# Patient Record
Sex: Male | Born: 1965 | Race: White | Hispanic: No | Marital: Married | Smoking: Never smoker
Health system: Southern US, Community
[De-identification: ages and names within clinical notes are randomized; demographics above are authoritative.]

## PROBLEM LIST (undated history)

## (undated) DIAGNOSIS — S92909A Unspecified fracture of unspecified foot, initial encounter for closed fracture: Secondary | ICD-10-CM

## (undated) DIAGNOSIS — G43909 Migraine, unspecified, not intractable, without status migrainosus: Secondary | ICD-10-CM

## (undated) DIAGNOSIS — I519 Heart disease, unspecified: Secondary | ICD-10-CM

## (undated) DIAGNOSIS — N201 Calculus of ureter: Secondary | ICD-10-CM

## (undated) DIAGNOSIS — N200 Calculus of kidney: Secondary | ICD-10-CM

## (undated) HISTORY — DX: Unspecified fracture of unspecified foot, initial encounter for closed fracture: S92.909A

## (undated) HISTORY — DX: Heart disease, unspecified: I51.9

## (undated) HISTORY — PX: CARDIOVASCULAR STRESS TEST: SHX262

## (undated) HISTORY — DX: Migraine, unspecified, not intractable, without status migrainosus: G43.909

---

## 2002-08-18 ENCOUNTER — Encounter: Payer: Self-pay | Admitting: Emergency Medicine

## 2002-08-18 ENCOUNTER — Emergency Department (HOSPITAL_COMMUNITY): Admission: EM | Admit: 2002-08-18 | Discharge: 2002-08-18 | Payer: Self-pay | Admitting: Emergency Medicine

## 2009-06-20 DIAGNOSIS — N200 Calculus of kidney: Secondary | ICD-10-CM

## 2009-06-20 HISTORY — DX: Calculus of kidney: N20.0

## 2011-03-14 ENCOUNTER — Encounter (HOSPITAL_COMMUNITY): Payer: Self-pay

## 2011-03-14 ENCOUNTER — Emergency Department (HOSPITAL_COMMUNITY)
Admission: EM | Admit: 2011-03-14 | Discharge: 2011-03-14 | Disposition: A | Discharge status: Home / Self Care | Payer: Self-pay | Attending: Emergency Medicine | Admitting: Emergency Medicine

## 2011-03-14 ENCOUNTER — Emergency Department (HOSPITAL_COMMUNITY): Payer: Self-pay

## 2011-03-14 DIAGNOSIS — N1330 Unspecified hydronephrosis: Secondary | ICD-10-CM | POA: Insufficient documentation

## 2011-03-14 DIAGNOSIS — N200 Calculus of kidney: Secondary | ICD-10-CM | POA: Insufficient documentation

## 2011-03-14 DIAGNOSIS — M545 Low back pain, unspecified: Secondary | ICD-10-CM | POA: Insufficient documentation

## 2011-03-14 DIAGNOSIS — R109 Unspecified abdominal pain: Secondary | ICD-10-CM | POA: Insufficient documentation

## 2011-03-14 DIAGNOSIS — N201 Calculus of ureter: Secondary | ICD-10-CM | POA: Insufficient documentation

## 2011-03-14 DIAGNOSIS — R110 Nausea: Secondary | ICD-10-CM | POA: Insufficient documentation

## 2011-03-14 HISTORY — DX: Calculus of kidney: N20.0

## 2011-03-14 LAB — URINALYSIS, ROUTINE W REFLEX MICROSCOPIC
Bilirubin Urine: NEGATIVE
Ketones, ur: 15 mg/dL — AB
Leukocytes, UA: NEGATIVE
Nitrite: NEGATIVE
Urobilinogen, UA: 0.2 mg/dL (ref 0.0–1.0)
pH: 5.5 (ref 5.0–8.0)

## 2011-03-14 LAB — POCT I-STAT, CHEM 8
BUN: 10 mg/dL (ref 6–23)
Calcium, Ion: 1.12 mmol/L (ref 1.12–1.32)
Glucose, Bld: 113 mg/dL — ABNORMAL HIGH (ref 70–99)
HCT: 42 % (ref 39.0–52.0)
Hemoglobin: 14.3 g/dL (ref 13.0–17.0)
Sodium: 141 mEq/L (ref 135–145)
TCO2: 22 mmol/L (ref 0–100)

## 2011-03-14 LAB — URINE MICROSCOPIC-ADD ON

## 2011-08-05 ENCOUNTER — Encounter (HOSPITAL_COMMUNITY): Payer: Self-pay | Admitting: Pharmacy Technician

## 2011-08-05 ENCOUNTER — Other Ambulatory Visit: Payer: Self-pay | Admitting: Urology

## 2011-08-05 ENCOUNTER — Encounter (HOSPITAL_COMMUNITY): Payer: Self-pay | Admitting: *Deleted

## 2011-08-05 NOTE — Progress Notes (Signed)
Pt reminded to take laxative 08/10/11 pm and no Aspirin or aspirin products 4 days prior to procedure

## 2011-08-09 ENCOUNTER — Encounter (HOSPITAL_COMMUNITY): Payer: Self-pay | Admitting: Pharmacy Technician

## 2011-08-10 MED ORDER — CIPROFLOXACIN HCL 500 MG PO TABS
500.0000 mg | ORAL_TABLET | ORAL | Status: AC
  Administered 2011-08-11: 500 mg via ORAL

## 2011-08-10 NOTE — H&P (Signed)
  Active Problems Problems  1. Ureteral Stone Right 592.1  History of Present Illness  Charles Mccall returns tdoay in f/u for his 7mm right ureteral stone that was initially diagnosed on 03/14/11.  He has remained generally symptom free since September.  He took tamsulosin for a while but is out.   Past Medical History Problems  1. History of  Nephrolithiasis V13.01  Surgical History Problems  1. History of  No Surgical Problems  Current Meds 1. Oxycodone-Acetaminophen 5-325 MG Oral Tablet; TAKE 1 TO 2 TABLETS EVERY 4 TO 6 HOURS  AS NEEDED; Therapy: 11Oct2012 to (Evaluate:14Oct2012); Last Rx:11Oct2012 2. Zofran 4 MG Oral Tablet; Therapy: (Recorded:24Sep2012) to  Allergies Medication  1. Penicillins  Family History Problems  1. Family history of  Family Health Status Number Of Children 2 sons  Social History Problems  1. Caffeine Use 3 a day 2. Marital History - Currently Married 3. Never A Smoker 4. Occupation: Psychologist, occupational  5. History of  Alcohol Use  Review of Systems  Cardiovascular: no chest pain.  Respiratory: no shortness of breath.    Vitals Vital Signs [Data Includes: Last 1 Day]  24Dec2012 10:07AM  Blood Pressure: 127 / 75 Temperature: 97.2 F Heart Rate: 78  Physical Exam Constitutional: Well nourished and well developed . No acute distress.  Pulmonary: No respiratory distress and normal respiratory rhythm and effort.  Cardiovascular: Heart rate and rhythm are normal . No peripheral edema.    Results/Data Urine [Data Includes: Last 1 Day]   24Dec2012  COLOR YELLOW   APPEARANCE CLEAR   SPECIFIC GRAVITY 1.025   pH 5.5   GLUCOSE NEG mg/dL  BILIRUBIN NEG   KETONE NEG mg/dL  BLOOD NEG   PROTEIN NEG mg/dL  UROBILINOGEN 0.2 mg/dL  NITRITE NEG   LEUKOCYTE ESTERASE NEG    The following images/tracing/specimen were independently visualized:  KUB today shows minimal change in the location of the right mid ureteral stone. It may have moved a cm or so.  He has stable right renal stones.    Assessment Assessed  1. Ureteral Stone Right 592.1   He has minimal progression of his right mid ureteral stone, but is not having too much in the way of symptoms.   Plan Health Maintenance (V70.0)  1. UA With REFLEX  Done: 24Dec2012 09:20AM Ureteral Stone (592.1)  2. Follow-up Schedule Surgery Office  Follow-up  Requested for: 24Dec2012   I discussed ESWL and ureteroscopy and have reviewed the risks in detail.   He is most interested in ESWL and the risks noted are bleeding, infection, injury to adjacent organs, failure of fragmentation and need for secondary procedures, thrombotic events and sedation risks.   He is supposed to get insurance coverage at the first of the year and would like to evaluate his coverage situation prior to scheduling.

## 2011-08-10 NOTE — Discharge Instructions (Signed)
Lithotripsy Care After Refer to this sheet for the next few weeks. These discharge instructions provide you with general information on caring for yourself after you leave the hospital. Your caregiver may also give you specific instructions. Your treatment has been planned according to the most current medical practices available, but unavoidable complications sometimes occur. If you have any problems or questions after discharge, please call your caregiver. AFTER THE PROCEDURE   The recovery time will vary with the procedure done.   You will be taken to the recovery area. A nurse will watch and check your progress. Once you are awake, stable, and taking fluids well, you will be allowed to go home as long as there are no problems.   Your urine may have a red tinge for a few days after treatment. Blood loss is usually minimal.   You may have soreness in the back or flank area. This usually goes away after a few days. The procedure can cause blotches or bruises on the back where the pressure wave enters the skin. These marks usually cause only minimal discomfort and should disappear in a short time.   Stone fragments should begin to pass within 24 hours of treatment. However, a delayed passage is not unusual.   You may have pain, discomfort, and feel sick to your stomach (nauseous) when the crushed (pulverized) fragments of stone are passed down the tube from the kidney to the bladder. Stone fragments can pass soon after the procedure and may last for up to 4 to 8 weeks.   A small number of patients may have severe pain when stone fragments are not able to pass, which leads to an obstruction.   If your stone is greater than 1 inch/2.5 centimeters in diameter or if you have multiple stones that have a combined diameter greater than 1 inch/2.5 centimeters, you may require more than 1 treatment.   You must have someone drive you home.  HOME CARE INSTRUCTIONS   Rest at home until you feel your  energy improving.   Only take over-the-counter or prescription medicines for pain, discomfort, or fever as directed by your caregiver. Depending on the type of lithotripsy, you may need to take medicines (antibiotics) that kill germs and anti-inflammatory medicines for a few days.   Drink enough water and fluids to keep your urine clear or pale yellow. This helps "flush" your kidneys. It helps pass any remaining pieces of stone and prevents stones from coming back.   Most people can resume daily activities within 1 or 2 days after standard lithotripsy. It can take longer to recover from laser and percutaneous lithotripsy.   If the stones are in your urinary system, you may be asked to strain your urine at home to look for stones. Any stones that are found can be sent to a medical lab for examination.   Visit your caregiver for a follow-up appointment in a few weeks. Your doctor may remove your stent if you have one. Your caregiver will also check to see whether stone particles still remain.  SEEK MEDICAL CARE IF:   You have an oral temperature above 102 F (38.9 C).   Your pain is not relieved by medicine.   You have a lasting nauseous feeling.   You feel there is too much blood in the urine.   You develop persistent problems with frequent and/or painful urination that does not at least partially improve after 2 days following the procedure.   You have a congested   cough.  °· You feel lightheaded.  °· You develop a rash or any other signs that might suggest an allergic problem.  °· You develop any reaction or side effects to your medicine(s).  °SEEK IMMEDIATE MEDICAL CARE IF:  °· You experience severe back and/or flank pain.  °· You see nothing but blood when you urinate.  °· You cannot pass any urine at all.  °· You have an oral temperature above 102° F (38.9° C), not controlled by medicine.  °· You develop shortness of breath, difficulty breathing, or chest pain.  °· You develop vomiting  that will not stop after 6 to 8 hours.  °· You have a fainting episode.  °MAKE SURE YOU:  °· Understand these instructions.  °· Will watch your condition.  °· Will get help right away if you are not doing well or get worse.  °Document Released: 06/26/2007 Document Revised: 02/02/2011 Document Reviewed: 06/26/2007 °[employer]® Patient Information ©2012 [employer]. °

## 2011-08-11 ENCOUNTER — Ambulatory Visit (HOSPITAL_COMMUNITY): Payer: Self-pay

## 2011-08-11 ENCOUNTER — Encounter (HOSPITAL_COMMUNITY)
Admission: RE | Disposition: A | Discharge status: Home / Self Care | Payer: Self-pay | Source: Ambulatory Visit | Attending: Urology

## 2011-08-11 ENCOUNTER — Ambulatory Visit (HOSPITAL_COMMUNITY)
Admission: RE | Admit: 2011-08-11 | Discharge: 2011-08-11 | Disposition: A | Discharge status: Home / Self Care | Payer: Self-pay | Source: Ambulatory Visit | Attending: Urology | Admitting: Urology

## 2011-08-11 ENCOUNTER — Encounter (HOSPITAL_COMMUNITY): Payer: Self-pay | Admitting: *Deleted

## 2011-08-11 DIAGNOSIS — Z01818 Encounter for other preprocedural examination: Secondary | ICD-10-CM | POA: Insufficient documentation

## 2011-08-11 DIAGNOSIS — Z79899 Other long term (current) drug therapy: Secondary | ICD-10-CM | POA: Insufficient documentation

## 2011-08-11 DIAGNOSIS — N201 Calculus of ureter: Secondary | ICD-10-CM | POA: Diagnosis present

## 2011-08-11 HISTORY — PX: EXTRACORPOREAL SHOCK WAVE LITHOTRIPSY: SHX1557

## 2011-08-11 SURGERY — LITHOTRIPSY, ESWL
Anesthesia: LOCAL | Laterality: Right

## 2011-08-11 MED ORDER — DEXTROSE-NACL 5-0.45 % IV SOLN
INTRAVENOUS | Status: DC
  Administered 2011-08-11: 06:00:00 via INTRAVENOUS

## 2011-08-11 MED ORDER — DIAZEPAM 5 MG PO TABS
10.0000 mg | ORAL_TABLET | ORAL | Status: AC
  Administered 2011-08-11: 10 mg via ORAL

## 2011-08-11 MED ORDER — TAMSULOSIN HCL 0.4 MG PO CAPS: 0.4000 mg | ORAL_CAPSULE | Freq: Every day | ORAL | Status: DC

## 2011-08-11 MED ORDER — DIPHENHYDRAMINE HCL 25 MG PO CAPS
ORAL_CAPSULE | ORAL | Status: AC
  Filled 2011-08-11: qty 1

## 2011-08-11 MED ORDER — DIAZEPAM 5 MG PO TABS
ORAL_TABLET | ORAL | Status: AC
  Filled 2011-08-11: qty 2

## 2011-08-11 MED ORDER — OXYCODONE-ACETAMINOPHEN 5-325 MG PO TABS: 1.0000 | ORAL_TABLET | ORAL | Status: AC | PRN

## 2011-08-11 MED ORDER — CIPROFLOXACIN HCL 500 MG PO TABS
ORAL_TABLET | ORAL | Status: AC
  Filled 2011-08-11: qty 1

## 2011-08-11 MED ORDER — DIPHENHYDRAMINE HCL 25 MG PO CAPS
25.0000 mg | ORAL_CAPSULE | ORAL | Status: AC
  Administered 2011-08-11: 25 mg via ORAL

## 2011-08-11 NOTE — Progress Notes (Signed)
Very sleepy encouraged deep breathing and fluids.

## 2011-08-11 NOTE — Progress Notes (Signed)
Laxative as directed NPO past midnight no aspirin or ibuprofen.

## 2011-08-11 NOTE — Interval H&P Note (Signed)
History and Physical Interval Note:  08/11/2011 7:35 AM  Charles Mccall  has presented today for surgery, with the diagnosis of right ureteral stone   The various methods of treatment have been discussed with the patient and family. After consideration of risks, benefits and other options for treatment, the patient has consented to  Procedure(s) (LRB): EXTRACORPOREAL SHOCK WAVE LITHOTRIPSY (ESWL) (Right) as a surgical intervention .  The patients' history has been reviewed, patient examined, no change in status, stable for surgery.  I have reviewed the patients' chart and labs.  Questions were answered to the patient's satisfaction.     Kellina Dreese J

## 2011-08-11 NOTE — Op Note (Signed)
Please see [facility] op note scanned in the chart.

## 2011-08-12 ENCOUNTER — Encounter (HOSPITAL_COMMUNITY): Payer: Self-pay

## 2011-09-14 ENCOUNTER — Other Ambulatory Visit: Payer: Self-pay | Admitting: Urology

## 2011-09-26 ENCOUNTER — Encounter (HOSPITAL_BASED_OUTPATIENT_CLINIC_OR_DEPARTMENT_OTHER): Payer: Self-pay | Admitting: *Deleted

## 2011-09-26 NOTE — Progress Notes (Signed)
NPO AFTER MN. ARRIVES AT 0815. NEEDS KUB, HG , AND EKG. MAY TAKE RX PAIN MED IF NEEDED W/ SIPS OF WATER.

## 2011-09-28 NOTE — H&P (Signed)
   Charles Mccall had an episode of pain on Sunday and a KUB today shows that his RUVJ stone hasn't moved and an additional 3-10mm stone has fallen into the distal ureter.  He is doing well today.   Past Medical History Problems  1. History of  Nephrolithiasis V13.01  Surgical History Problems  1. History of  Lithotripsy 2. History of  No Surgical Problems  Current Meds 1. Oxycodone-Acetaminophen 5-325 MG Oral Tablet; TAKE 1 TO 2 TABLETS EVERY 4 TO 6 HOURS  AS NEEDED; Therapy: 11Oct2012 to (Evaluate:14Oct2012); Last Rx:11Oct2012 2. Zofran 4 MG Oral Tablet; Therapy: (Recorded:24Sep2012) to  Allergies Medication  1. Penicillins 2. Tamsulosin HCl CAPS  Family History Problems  1. Family history of  Family Health Status Number Of Children 2 sons  Social History Problems  1. Caffeine Use 3 a day 2. Marital History - Currently Married 3. Never A Smoker 4. Occupation: Psychologist, occupational  5. History of  Alcohol Use  Review of Systems  Gastrointestinal: flank pain, but no nausea.    Vitals Vital Signs [Data Includes: Last 1 Day]  27Mar2013 09:31AM  BMI Calculated: 28.7 BSA Calculated: 2.13 Height: 5 ft 11 in Weight: 205 lb  Blood Pressure: 116 / 78 Temperature: 98.3 F Heart Rate: 77  Physical Exam Pulmonary: No respiratory distress and normal respiratory rhythm and effort.  Cardiovascular: Heart rate and rhythm are normal . No peripheral edema.    Results/Data Urine [Data Includes: Last 1 Day]   27Mar2013  COLOR YELLOW   APPEARANCE CLEAR   SPECIFIC GRAVITY 1.025   pH 5.5   GLUCOSE NEG mg/dL  BILIRUBIN NEG   KETONE TRACE mg/dL  BLOOD NEG   PROTEIN TRACE mg/dL  UROBILINOGEN 0.2 mg/dL  NITRITE NEG   LEUKOCYTE ESTERASE NEG    The following images/tracing/specimen were independently visualized:  KUB reviewed and noted above.    Assessment Assessed  1. Ureteral Stone Right 592.1   RUVJ stones with recurrent pain.   Plan Ureteral Stone (592.1)  1. Follow-up  Schedule Surgery Office  Follow-up  Requested for: 27Mar2013   I am going to get him set up for right ureteroscopy.  I have reviewed the risks of bleeding, infection, ureteral injury, need for stent, thrombotic events and anesthetic complications.

## 2011-09-29 ENCOUNTER — Ambulatory Visit (HOSPITAL_COMMUNITY): Payer: Managed Care, Other (non HMO)

## 2011-09-29 ENCOUNTER — Encounter (HOSPITAL_BASED_OUTPATIENT_CLINIC_OR_DEPARTMENT_OTHER): Payer: Self-pay | Admitting: Anesthesiology

## 2011-09-29 ENCOUNTER — Encounter (HOSPITAL_BASED_OUTPATIENT_CLINIC_OR_DEPARTMENT_OTHER): Payer: Self-pay

## 2011-09-29 ENCOUNTER — Ambulatory Visit (HOSPITAL_BASED_OUTPATIENT_CLINIC_OR_DEPARTMENT_OTHER)
Admission: RE | Admit: 2011-09-29 | Discharge: 2011-09-29 | Disposition: A | Discharge status: Home / Self Care | Payer: Managed Care, Other (non HMO) | Source: Ambulatory Visit | Attending: Urology | Admitting: Urology

## 2011-09-29 ENCOUNTER — Encounter (HOSPITAL_BASED_OUTPATIENT_CLINIC_OR_DEPARTMENT_OTHER)
Admission: RE | Disposition: A | Discharge status: Home / Self Care | Payer: Self-pay | Source: Ambulatory Visit | Attending: Urology

## 2011-09-29 ENCOUNTER — Ambulatory Visit (HOSPITAL_BASED_OUTPATIENT_CLINIC_OR_DEPARTMENT_OTHER): Payer: Managed Care, Other (non HMO) | Admitting: Anesthesiology

## 2011-09-29 DIAGNOSIS — N201 Calculus of ureter: Secondary | ICD-10-CM | POA: Insufficient documentation

## 2011-09-29 HISTORY — DX: Calculus of ureter: N20.1

## 2011-09-29 LAB — POCT HEMOGLOBIN-HEMACUE: Hemoglobin: 14.5 g/dL (ref 13.0–17.0)

## 2011-09-29 SURGERY — CYSTOURETEROSCOPY, WITH RETROGRADE PYELOGRAM AND STENT INSERTION
Anesthesia: General | Site: Ureter | Wound class: Clean Contaminated

## 2011-09-29 MED ORDER — ONDANSETRON HCL 4 MG/2ML IJ SOLN: 4.0000 mg | Freq: Four times a day (QID) | INTRAMUSCULAR | Status: DC | PRN

## 2011-09-29 MED ORDER — SODIUM CHLORIDE 0.9 % IJ SOLN: 3.0000 mL | Freq: Two times a day (BID) | INTRAMUSCULAR | Status: DC

## 2011-09-29 MED ORDER — OXYCODONE HCL 5 MG PO TABS: 5.0000 mg | ORAL_TABLET | ORAL | Status: DC | PRN

## 2011-09-29 MED ORDER — BELLADONNA ALKALOIDS-OPIUM 16.2-60 MG RE SUPP
RECTAL | Status: DC | PRN
  Administered 2011-09-29: 1 via RECTAL

## 2011-09-29 MED ORDER — IOHEXOL 350 MG/ML SOLN
INTRAVENOUS | Status: DC | PRN
  Administered 2011-09-29: 10 mL via INTRAVENOUS

## 2011-09-29 MED ORDER — ACETAMINOPHEN 650 MG RE SUPP: 650.0000 mg | RECTAL | Status: DC | PRN

## 2011-09-29 MED ORDER — HYDROCODONE-ACETAMINOPHEN 5-500 MG PO TABS: 1.0000 | ORAL_TABLET | ORAL | Status: DC | PRN

## 2011-09-29 MED ORDER — MIDAZOLAM HCL 5 MG/5ML IJ SOLN
INTRAMUSCULAR | Status: DC | PRN
  Administered 2011-09-29: 2 mg via INTRAVENOUS

## 2011-09-29 MED ORDER — HYOSCYAMINE SULFATE 0.125 MG SL SUBL: 0.1250 mg | SUBLINGUAL_TABLET | SUBLINGUAL | Status: DC | PRN

## 2011-09-29 MED ORDER — LIDOCAINE HCL 2 % EX GEL
CUTANEOUS | Status: DC | PRN
  Administered 2011-09-29: 1

## 2011-09-29 MED ORDER — CIPROFLOXACIN IN D5W 400 MG/200ML IV SOLN
400.0000 mg | INTRAVENOUS | Status: AC
  Administered 2011-09-29: 400 mg via INTRAVENOUS

## 2011-09-29 MED ORDER — HYDROCODONE-ACETAMINOPHEN 5-325 MG PO TABS
1.0000 | ORAL_TABLET | ORAL | Status: AC | PRN
  Administered 2011-09-29: 1 via ORAL

## 2011-09-29 MED ORDER — LACTATED RINGERS IV SOLN
INTRAVENOUS | Status: DC | PRN
  Administered 2011-09-29 (×2): via INTRAVENOUS

## 2011-09-29 MED ORDER — ONDANSETRON HCL 4 MG/2ML IJ SOLN
INTRAMUSCULAR | Status: DC | PRN
  Administered 2011-09-29: 4 mg via INTRAVENOUS

## 2011-09-29 MED ORDER — SODIUM CHLORIDE 0.9 % IJ SOLN: 3.0000 mL | INTRAMUSCULAR | Status: DC | PRN

## 2011-09-29 MED ORDER — DEXAMETHASONE SODIUM PHOSPHATE 4 MG/ML IJ SOLN
INTRAMUSCULAR | Status: DC | PRN
  Administered 2011-09-29: 4 mg via INTRAVENOUS

## 2011-09-29 MED ORDER — PHENAZOPYRIDINE HCL 200 MG PO TABS
200.0000 mg | ORAL_TABLET | Freq: Three times a day (TID) | ORAL | Status: DC
  Administered 2011-09-29: 200 mg via ORAL

## 2011-09-29 MED ORDER — PROPOFOL 10 MG/ML IV EMUL
INTRAVENOUS | Status: DC | PRN
  Administered 2011-09-29: 250 mg via INTRAVENOUS

## 2011-09-29 MED ORDER — ACETAMINOPHEN 325 MG PO TABS: 650.0000 mg | ORAL_TABLET | ORAL | Status: DC | PRN

## 2011-09-29 MED ORDER — FENTANYL CITRATE 0.05 MG/ML IJ SOLN
INTRAMUSCULAR | Status: DC | PRN
  Administered 2011-09-29: 25 ug via INTRAVENOUS
  Administered 2011-09-29: 50 ug via INTRAVENOUS
  Administered 2011-09-29: 100 ug via INTRAVENOUS
  Administered 2011-09-29 (×5): 25 ug via INTRAVENOUS

## 2011-09-29 MED ORDER — LIDOCAINE HCL (CARDIAC) 20 MG/ML IV SOLN
INTRAVENOUS | Status: DC | PRN
  Administered 2011-09-29: 100 mg via INTRAVENOUS

## 2011-09-29 MED ORDER — FENTANYL CITRATE 0.05 MG/ML IJ SOLN: 25.0000 ug | INTRAMUSCULAR | Status: DC | PRN

## 2011-09-29 MED ORDER — PHENAZOPYRIDINE HCL 200 MG PO TABS: 200.0000 mg | ORAL_TABLET | Freq: Three times a day (TID) | ORAL | Status: AC | PRN

## 2011-09-29 MED ORDER — SODIUM CHLORIDE 0.9 % IR SOLN
Status: DC | PRN
  Administered 2011-09-29: 6000 mL

## 2011-09-29 MED ORDER — KETOROLAC TROMETHAMINE 30 MG/ML IJ SOLN
INTRAMUSCULAR | Status: DC | PRN
  Administered 2011-09-29: 30 mg via INTRAVENOUS

## 2011-09-29 MED ORDER — SODIUM CHLORIDE 0.9 % IV SOLN: 250.0000 mL | INTRAVENOUS | Status: DC | PRN

## 2011-09-29 MED ORDER — LACTATED RINGERS IV SOLN
INTRAVENOUS | Status: DC
  Administered 2011-09-29: 09:00:00 via INTRAVENOUS

## 2011-09-29 SURGICAL SUPPLY — 37 items
BAG DRAIN URO-CYSTO SKYTR STRL (DRAIN) ×2 IMPLANT
BAG DRN UROCATH (DRAIN) ×1
BALLN NEPHROSTOMY (BALLOONS) ×2
BALLOON NEPHROSTOMY (BALLOONS) IMPLANT
BASKET LASER NITINOL 1.9FR (BASKET) IMPLANT
BASKET ZERO TIP NITINOL 2.4FR (BASKET) ×1 IMPLANT
BRUSH URET BIOPSY 3F (UROLOGICAL SUPPLIES) IMPLANT
BSKT STON RTRVL 120 1.9FR (BASKET)
BSKT STON RTRVL ZERO TP 2.4FR (BASKET) ×1
CANISTER SUCT LVC 12 LTR MEDI- (MISCELLANEOUS) ×1 IMPLANT
CATH URET 5FR 28IN CONE TIP (BALLOONS)
CATH URET 5FR 28IN OPEN ENDED (CATHETERS) IMPLANT
CATH URET 5FR 70CM CONE TIP (BALLOONS) IMPLANT
CLOTH BEACON ORANGE TIMEOUT ST (SAFETY) ×2 IMPLANT
DRAPE CAMERA CLOSED 9X96 (DRAPES) ×2 IMPLANT
ELECT REM PT RETURN 9FT ADLT (ELECTROSURGICAL)
ELECTRODE REM PT RTRN 9FT ADLT (ELECTROSURGICAL) IMPLANT
GLOVE ECLIPSE 6.5 STRL STRAW (GLOVE) ×1 IMPLANT
GLOVE INDICATOR 7.0 STRL GRN (GLOVE) ×1 IMPLANT
GLOVE SURG SS PI 8.0 STRL IVOR (GLOVE) ×2 IMPLANT
GOWN PREVENTION PLUS LG XLONG (DISPOSABLE) IMPLANT
GOWN STRL REIN XL XLG (GOWN DISPOSABLE) ×2 IMPLANT
GUIDEWIRE 0.038 PTFE COATED (WIRE) IMPLANT
GUIDEWIRE ANG ZIPWIRE 038X150 (WIRE) IMPLANT
GUIDEWIRE STR DUAL SENSOR (WIRE) ×2 IMPLANT
IV NS IRRIG 3000ML ARTHROMATIC (IV SOLUTION) ×3 IMPLANT
KIT BALLIN UROMAX 15FX10 (LABEL) IMPLANT
KIT BALLN UROMAX 15FX4 (MISCELLANEOUS) IMPLANT
KIT BALLN UROMAX 26 75X4 (MISCELLANEOUS) ×1
LASER FIBER DISP (UROLOGICAL SUPPLIES) ×1 IMPLANT
LASER FIBER DISP 1000U (UROLOGICAL SUPPLIES) IMPLANT
PACK CYSTOSCOPY (CUSTOM PROCEDURE TRAY) ×2 IMPLANT
SET HIGH PRES BAL DIL (LABEL)
SHEATH URET ACCESS 12FR/35CM (UROLOGICAL SUPPLIES) ×1 IMPLANT
SHEATH URET ACCESS 12FR/55CM (UROLOGICAL SUPPLIES) IMPLANT
STENT CONTOUR 6FRX26X.038 (STENTS) IMPLANT
STENT URET 6FRX26 CONTOUR (STENTS) ×1 IMPLANT

## 2011-09-29 NOTE — Anesthesia Postprocedure Evaluation (Signed)
  Anesthesia Post-op Note  Patient: Charles Mccall  Procedure(s) Performed: Procedure(s) (LRB): CYSTOSCOPY WITH RETROGRADE PYELOGRAM, URETEROSCOPY AND STENT PLACEMENT (N/A)  Patient Location: PACU  Anesthesia Type: General  Level of Consciousness: awake and alert   Airway and Oxygen Therapy: Patient Spontanous Breathing  Post-op Pain: mild  Post-op Assessment: Post-op Vital signs reviewed, Patient's Cardiovascular Status Stable, Respiratory Function Stable, Patent Airway and No signs of Nausea or vomiting  Post-op Vital Signs: stable  Complications: No apparent anesthesia complications

## 2011-09-29 NOTE — Discharge Instructions (Addendum)
Cystoscopy (Bladder Exam) A cystoscopy is an examination of your urinary bladder with a cystoscope. A cystoscope is an instrument like a small telescope with strong lights and lenses. It is inserted into the bladder through the urethra (the opening into the bladder) and allows your caregiver to examine the inside of your bladder. The procedure causes little discomfort and can be done in a hospital or office. It is a diagnostic procedure to evaluate the inside of your bladder. It may involve x-rays to further evaluate the ureters or internal aspects of the kidneys. It may aid in the removal of urinary stones  or in taking tissue samples (biopsies) if necessary. The procedure is easier in females because of a shorter urethra. In a male, the procedure must be done through the penis. This often requires more sedation and more time to do the procedure. The procedure usually takes twenty minutes to one half hour for a male and approximately an hour for a male. LET YOUR CAREGIVERS KNOW ABOUT:  Allergies.   Medications taken including herbs, eye drops, over the counter medications, and creams.   Use of steroids (by mouth or creams).   Previous problems with anesthetics or novocaine.   Possibility of pregnancy, if this applies.   History of blood clots (thrombophlebitis).   History of bleeding or blood problems.   Previous surgery, especially where prosthetics have been used like hip or knee replacements, and heart valve replacements.   Other health problems.  BEFORE THE PROCEDURE  You should be present 60 minutes prior to your procedure or as directed.  PROCEDURE During the procedure, you will:  Be assisted by your urologist and a nurse.   Lie on a cystoscopy table with your knees elevated and legs apart and covered with a drape. For women this is the same position as when a pap smear is taken.   Have the urethral area or penis washed and covered with sterile towels.   Have an anesthetic  (numbing) jelly applied to the urethra. This is usually all that is required for females but males may also require sedation.   Have the cystoscope inserted through the urethra and into the bladder. Sterile fluid will flow through the cystoscope and into the bladder. This will expand the bladder and provide clear fluid for the urologist to look through and examine the interior of the bladder.   Be allowed to go home once you are doing well, are stable, and awake if you were given a sedative. If given a sedative, have someone give you a ride home.  AFTER THE PROCEDURE  You may have temporary bleeding and burning on urination.   Drink lots of fluids.  SEEK IMMEDIATE MEDICAL CARE IF:  There is an increase in blood in the urine or if you are passing clots.   You have difficulty in passing your urine   You develop chills and/or an unexplained oral temperature above 102 F (38.9 C).  Your caregiver will discuss your results with you following the procedure. This may be at a later time if you have been sedated. If other testing or biopsies were taken, ask your caregiver how you are to obtain the results. Remember it is your responsibility to get your results. Do not assume everything is normal if you do not hear from your caregiver. Document Released: 06/03/2000 Document Revised: 05/26/2011 Document Reviewed: 03/27/2008 [facility] Patient Information 2012 [place], [place].  You can remove the stent by pulling the attached string on Sunday morning.  It is not unusual to have some pain after the stent comes out   Post Anesthesia Home Care Instructions  Activity: Get plenty of rest for the remainder of the day. A responsible adult should stay with you for 24 hours following the procedure.  For the next 24 hours, DO NOT: -Drive a car -Advertising copywriter -Drink alcoholic beverages -Take any medication unless instructed by your physician -Make any legal decisions or sign important  papers.  Meals: Start with liquid foods such as gelatin or soup. Progress to regular foods as tolerated. Avoid greasy, spicy, heavy foods. If nausea and/or vomiting occur, drink only clear liquids until the nausea and/or vomiting subsides. Call your physician if vomiting continues.  Special Instructions/Symptoms: Your throat may feel dry or sore from the anesthesia or the breathing tube placed in your throat during surgery. If this causes discomfort, gargle with warm salt water. The discomfort should disappear within 24 hours.  Ureteral Stent A ureteral stent is a soft plastic tube with multiple holes. The stent is inserted into a ureter to help drain urine from the kidney into the bladder. The tube has a coil on each end to keep it from falling out. One end stays in the kidney. The other end stays in the bladder. A stent cannot be seen from the outside. Usually it does not keep you from going about normal routines. A ureteral stent is used to bypass a blockage in your kidney or ureter. This blockage can be caused by kidney stones, scar tissue, pregnancy, or other causes. It can also be used during treatment to remove a kidney stone or to let a ureter heal after surgery. The stent allows urine to drain from the kidney into the bladder. It is most often taken out after the blockage has been removed or the ureter has healed. If a stent is needed for a long time, it will be changed every few months. INSERTING THE STENT Your stent is put in by a urologist. This is a medical doctor trained for treating genitourinary (kidney, ureter and bladder) problems. Before your stent is put in, your caregiver may order x-rays or other imaging tests of your kidneys and ureters. The stent is inserted in a hospital or same day surgical center. You can anticipate going home the same day. PROCEDURE  A special x-ray machine called a fluoroscope is used to guide the insertion of your stent. This allows your doctor to make  sure the stent is in the correct place.   First you are given anesthesia to keep you comfortable.   Then your doctor inserts a special lighted instrument called a cystoscope into your bladder. This allows your doctor to see the opening to the ureter.   A thin wire is carefully threaded into the bladder and up the ureter. The stent is inserted over the wire and the wire is then removed.  HOME CARE INSTRUCTIONS   While the stent is in place, you may feel some discomfort. Certain movements may trigger pain or a feeling that you need to urinate. Your caregiver may give you pain medication. Only take over-the-counter or prescription medicines for pain, discomfort, or fever as directed by your caregiver. Do not take aspirin as this can make bleeding worse.   You may be given medications to prevent infection or bladder spasms. Be sure to take all medications as directed.   Drink plenty of fluids.   You may have small amounts of bleeding causing your urine to be slightly red.  This is nothing to be concerned about.  REMOVAL OF THE STENT Your stent is left in until the blockage is resolved. This may take two weeks or longer. Before the stent is removed, you may have an x-ray make sure the ureter is open. The stent can be removed by your caregiver in the office. Medications may be given for comfort. Be sure to keep all follow-up appointments so your caregiver can check that you are healing properly. SEEK IMMEDIATE MEDICAL CARE IF:   Your urine is dark red or has blood clots.   You are incontinent (leaking urine).   You have an oral temperature above 102 F (38.9 C), chills, nausea (feeling sick to your stomach), or vomiting.   Your pain is not relieved by pain medication. Do not take aspirin as this can make bleeding worse.   The end of the stent comes out of the urethra.  Document Released: 06/03/2000 Document Revised: 05/26/2011 Document Reviewed: 06/02/2008 [facility] Patient Information 2012  [place], [place].Marland Kitchen

## 2011-09-29 NOTE — Brief Op Note (Signed)
09/29/2011  10:41 AM  PATIENT:  Charles Mccall  46 y.o. male  PRE-OPERATIVE DIAGNOSIS:  right distal ureteral stone  POST-OPERATIVE DIAGNOSIS:  right distal ureteral stone  PROCEDURE:  Procedure(s) (LRB): CYSTOSCOPY WITH  URETEROSCOPY WITH LASER  AND STENT PLACEMENT (N/A)  SURGEON:  Surgeon(s) and Role:    * Anner Crete, MD - Primary  PHYSICIAN ASSISTANT:   ASSISTANTS: none   ANESTHESIA:   general  EBL:     BLOOD ADMINISTERED:none  DRAINS: 6 x 26cm JJ stent.   LOCAL MEDICATIONS USED:  LIDOCAINE  and Amount: Jelly 2% 10 ml  SPECIMEN:  Source of Specimen:  stone fragments  DISPOSITION OF SPECIMEN:  To family  COUNTS:  YES  TOURNIQUET:  * No tourniquets in log *  DICTATION: .Other Dictation: Dictation Number [id number]  PLAN OF CARE: Discharge to home after PACU  PATIENT DISPOSITION:  PACU - hemodynamically stable.   Delay start of Pharmacological VTE agent (>24hrs) due to surgical blood loss or risk of bleeding: no

## 2011-09-29 NOTE — Anesthesia Preprocedure Evaluation (Addendum)
Anesthesia Evaluation  Patient identified by MRN, date of birth, ID band Patient awake    Reviewed: Allergy & Precautions, H&P , NPO status , Patient's Chart, lab work & pertinent test results  Airway Mallampati: II TM Distance: <3 FB Neck ROM: Full    Dental No notable dental hx.    Pulmonary neg pulmonary ROS,  breath sounds clear to auscultation  Pulmonary exam normal       Cardiovascular negative cardio ROS  Rhythm:Regular Rate:Normal     Neuro/Psych negative neurological ROS  negative psych ROS   GI/Hepatic negative GI ROS, Neg liver ROS,   Endo/Other  negative endocrine ROS  Renal/GU negative Renal ROS  negative genitourinary   Musculoskeletal negative musculoskeletal ROS (+)   Abdominal   Peds negative pediatric ROS (+)  Hematology negative hematology ROS (+)   Anesthesia Other Findings   Reproductive/Obstetrics negative OB ROS                           Anesthesia Physical Anesthesia Plan  ASA: I  Anesthesia Plan: General   Post-op Pain Management:    Induction: Intravenous  Airway Management Planned: LMA  Additional Equipment:   Intra-op Plan:   Post-operative Plan:   Informed Consent: I have reviewed the patients History and Physical, chart, labs and discussed the procedure including the risks, benefits and alternatives for the proposed anesthesia with the patient or authorized representative who has indicated his/her understanding and acceptance.   Dental advisory given  Plan Discussed with: CRNA  Anesthesia Plan Comments:         Anesthesia Quick Evaluation  

## 2011-09-29 NOTE — Transfer of Care (Signed)
Immediate Anesthesia Transfer of Care Note  Patient: Charles Mccall  Procedure(s) Performed: Procedure(s) (LRB): CYSTOSCOPY WITH RETROGRADE PYELOGRAM, URETEROSCOPY AND STENT PLACEMENT (N/A)  Patient Location: PACU  Anesthesia Type: General  Level of Consciousness: awake, sedated, patient cooperative and responds to stimulation  Airway & Oxygen Therapy: Patient Spontanous Breathing and Patient connected to face mask oxygen  Post-op Assessment: Report given to PACU RN, Post -op Vital signs reviewed and stable and Patient moving all extremities  Post vital signs: Reviewed and stable  Complications: No apparent anesthesia complications

## 2011-09-29 NOTE — Anesthesia Procedure Notes (Signed)
Procedure Name: LMA Insertion Date/Time: 09/29/2011 9:56 AM Performed by: Jessica Priest Pre-anesthesia Checklist: Patient identified, Emergency Drugs available, Suction available and Patient being monitored Patient Re-evaluated:Patient Re-evaluated prior to inductionOxygen Delivery Method: Circle System Utilized Preoxygenation: Pre-oxygenation with 100% oxygen Intubation Type: IV induction Ventilation: Mask ventilation without difficulty LMA: LMA with gastric port inserted LMA Size: 4.0 Number of attempts: 1 Placement Confirmation: positive ETCO2 Tube secured with: Tape Dental Injury: Teeth and Oropharynx as per pre-operative assessment

## 2011-09-29 NOTE — Interval H&P Note (Signed)
History and Physical Interval Note:  09/29/2011 9:48 AM  Charles Mccall  has presented today for surgery, with the diagnosis of right distal ureteral stone  The various methods of treatment have been discussed with the patient and family. After consideration of risks, benefits and other options for treatment, the patient has consented to  Procedure(s) (LRB): CYSTOSCOPY WITH RETROGRADE PYELOGRAM, URETEROSCOPY AND STENT PLACEMENT (N/A) as a surgical intervention .  The patients' history has been reviewed, patient examined, no change in status, stable for surgery.  I have reviewed the patients' chart and labs.  Questions were answered to the patient's satisfaction.     Deniese Oberry J

## 2011-09-30 NOTE — Op Note (Signed)
NAMEFERNAND, SORBELLO NO.:  [id number]  MEDICAL RECORD NO.:  [id number]  LOCATION:                                 FACILITY:  PHYSICIAN:  Excell Seltzer. Annabell Howells, M.D.    DATE OF BIRTH:  Dec 26, 1965  DATE OF PROCEDURE:  09/29/2011 DATE OF DISCHARGE:                              OPERATIVE REPORT   PROCEDURES:  Cystoscopy, right ureteroscopy with laser lithotripsy, and stone manipulation and insertion of right double-J stent.  PREOPERATIVE DIAGNOSIS:  Right distal ureteral stones.  POSTOPERATIVE DIAGNOSIS:  Right distal ureteral stones.  SURGEON:  Excell Seltzer. Annabell Howells, M.D.  ANESTHESIA:  General.  SPECIMEN:  Stone fragments.  DRAINS:  #6 French 26 cm double-J stent.  COMPLICATIONS:  None.  INDICATIONS:  Travus is a 46 year old white male with two  right distal ureteral stones that failed to progress.  He is to undergo ureteroscopy.  FINDINGS OF PROCEDURE:  He was taken to the operating room where general anesthetic was induced.  He was given Cipro.  PAS hoses were placed.  He was placed in lithotomy position.  His perineum and genitalia were prepped with Betadine solution and he was draped in the usual sterile fashion.  Cystoscopy was performed using a 22-French scope and 12 and 70 degree lenses.  Examination revealed a normal urethra.  The external sphincter was intact.  The prostatic urethra was short without obstruction.  Examination of bladder revealed a smooth wall with no tumor stones or inflammation.  Ureteral orifices were unremarkable.  The right ureteral orifice was cannulated with a guidewire, which was passed to the kidney without difficulty.  The cystoscope was removed.  An initial attempt to pass a 12-French introducer sheath and a core dilator was unsuccessful due to a narrowing just proximal to the meatus.  I then used a 4 cm 15-French balloon, which was advanced to just at the level of the stones and inflated with disappearance of the waist  just below the stone.  At this point, the 6-French short ureteroscope was passed alongside the wire.  The stones were visualized, they were engaged with a 375 micron laser fiber at 0.5 watts and 20 Hz and broken into manageable fragments.  The fragments were then removed with a Nitinol basket.  Once all fragments were removed, the ureter was inspected.  There was some mucosal splitting, an area of narrowing and it was felt a stent was indicated.  The cystoscope was reinserted alongside the wire and the stone fragments were evacuated.  I then inserted the cystoscope over the wire and a 6-French 26 cm double- J stent with string was passed without difficulty to the kidney under fluoroscopic guidance.  The wire was removed leaving a good coil in the kidney, good coil in the bladder.  At this point, the bladder was drained.  The cystoscope was removed leaving the stent string exiting the urethra.  The urethra was instilled with 10 mL of 2% lidocaine jelly.  B and O suppository had been placed at the beginning of the procedure.  The stent was secured to the patient's penis with pink tape.  The dressings were removed.  He was taken down from lithotomy  position.  His anesthetic was reversed.  He was moved to the recovery room in stable condition, there were no complications.     Excell Seltzer. Annabell Howells, M.D.     JJW/MEDQ  D:  09/29/2011  T:  09/30/2011  Job:  [id number]

## 2015-06-16 ENCOUNTER — Emergency Department (HOSPITAL_COMMUNITY)
Admission: EM | Admit: 2015-06-16 | Discharge: 2015-06-16 | Disposition: A | Discharge status: Home / Self Care | Payer: Self-pay | Attending: Emergency Medicine | Admitting: Emergency Medicine

## 2015-06-16 ENCOUNTER — Encounter (HOSPITAL_COMMUNITY): Payer: Self-pay | Admitting: Emergency Medicine

## 2015-06-16 ENCOUNTER — Emergency Department (HOSPITAL_COMMUNITY): Payer: Managed Care, Other (non HMO)

## 2015-06-16 DIAGNOSIS — Z880 Allergy status to penicillin: Secondary | ICD-10-CM | POA: Insufficient documentation

## 2015-06-16 DIAGNOSIS — N200 Calculus of kidney: Secondary | ICD-10-CM

## 2015-06-16 DIAGNOSIS — N201 Calculus of ureter: Secondary | ICD-10-CM

## 2015-06-16 DIAGNOSIS — Z79899 Other long term (current) drug therapy: Secondary | ICD-10-CM | POA: Insufficient documentation

## 2015-06-16 DIAGNOSIS — N202 Calculus of kidney with calculus of ureter: Secondary | ICD-10-CM | POA: Insufficient documentation

## 2015-06-16 DIAGNOSIS — R1031 Right lower quadrant pain: Secondary | ICD-10-CM

## 2015-06-16 LAB — URINALYSIS, ROUTINE W REFLEX MICROSCOPIC
BILIRUBIN URINE: NEGATIVE
GLUCOSE, UA: NEGATIVE mg/dL
Ketones, ur: NEGATIVE mg/dL
Nitrite: NEGATIVE
Protein, ur: 30 mg/dL — AB
SPECIFIC GRAVITY, URINE: 1.021 (ref 1.005–1.030)
pH: 7.5 (ref 5.0–8.0)

## 2015-06-16 LAB — LIPASE, BLOOD: Lipase: 23 U/L (ref 11–51)

## 2015-06-16 LAB — CBC
HCT: 43.2 % (ref 39.0–52.0)
Hemoglobin: 14.8 g/dL (ref 13.0–17.0)
MCH: 29.5 pg (ref 26.0–34.0)
MCHC: 34.3 g/dL (ref 30.0–36.0)
MCV: 86.1 fL (ref 78.0–100.0)
Platelets: 168 10*3/uL (ref 150–400)
RBC: 5.02 MIL/uL (ref 4.22–5.81)
RDW: 12.2 % (ref 11.5–15.5)
WBC: 9.8 10*3/uL (ref 4.0–10.5)

## 2015-06-16 LAB — COMPREHENSIVE METABOLIC PANEL
ALBUMIN: 4 g/dL (ref 3.5–5.0)
ALK PHOS: 70 U/L (ref 38–126)
ALT: 26 U/L (ref 17–63)
ANION GAP: 7 (ref 5–15)
AST: 22 U/L (ref 15–41)
BUN: 9 mg/dL (ref 6–20)
CALCIUM: 9.1 mg/dL (ref 8.9–10.3)
CHLORIDE: 108 mmol/L (ref 101–111)
CO2: 26 mmol/L (ref 22–32)
CREATININE: 0.76 mg/dL (ref 0.61–1.24)
GFR calc Af Amer: >60 mL/min (ref >60)
GFR calc non Af Amer: >60 mL/min (ref >60)
GLUCOSE: 122 mg/dL — AB (ref 65–99)
Potassium: 4.3 mmol/L (ref 3.5–5.1)
SODIUM: 141 mmol/L (ref 135–145)
Total Bilirubin: 0.8 mg/dL (ref 0.3–1.2)
Total Protein: 7 g/dL (ref 6.5–8.1)

## 2015-06-16 LAB — URINE MICROSCOPIC-ADD ON

## 2015-06-16 MED ORDER — KETOROLAC TROMETHAMINE 30 MG/ML IJ SOLN: 30.0000 mg | Freq: Once | INTRAMUSCULAR | Status: DC

## 2015-06-16 MED ORDER — SODIUM CHLORIDE 0.9 % IV BOLUS (SEPSIS)
1000.0000 mL | Freq: Once | INTRAVENOUS | Status: AC
  Administered 2015-06-16: 1000 mL via INTRAVENOUS

## 2015-06-16 MED ORDER — OXYCODONE-ACETAMINOPHEN 5-325 MG PO TABS: 2.0000 | ORAL_TABLET | ORAL | Status: DC | PRN

## 2015-06-16 MED ORDER — ONDANSETRON HCL 4 MG/2ML IJ SOLN
4.0000 mg | Freq: Once | INTRAMUSCULAR | Status: AC
  Administered 2015-06-16: 4 mg via INTRAVENOUS
  Filled 2015-06-16: qty 2

## 2015-06-16 MED ORDER — FENTANYL CITRATE (PF) 100 MCG/2ML IJ SOLN
50.0000 ug | Freq: Once | INTRAMUSCULAR | Status: AC
  Administered 2015-06-16: 50 ug via INTRAVENOUS
  Filled 2015-06-16: qty 2

## 2015-06-16 MED ORDER — TAMSULOSIN HCL 0.4 MG PO CAPS: 0.4000 mg | ORAL_CAPSULE | Freq: Every day | ORAL | Status: DC

## 2015-06-16 NOTE — ED Notes (Signed)
Pt reports RLQ abd pain since yesterday. Hx of kidney stones. No blood in urine or v/d.

## 2015-06-16 NOTE — Progress Notes (Signed)
Patient reports [facility] [place].  System updated.

## 2015-06-16 NOTE — ED Provider Notes (Signed)
CSN: [id number]     Arrival date & time 06/16/15  1129 History   None    Chief Complaint  Patient presents with  . Abdominal Pain    HPI   Charles Mccall is an 49 y.o. male with history of kidney stones who presents to the ED for evaluation of RLQ abdominal pain. He states his pain started last night at rest. He states that the pain would come and go and was relieved with OTC pain meds. However, he states he woke up this AM and the pain has gradually become worse and worse. He states that the pain was a "15 out of 10" on the car ride to the ED. He states the pain is in his RLQ and does not radiate. Pt states he has a history of multiple kidney stones and they have always presented similarly. He states that he has never gotten the typical flank pain/back pain with his prior kidney stones. He has not followed with urology in several years. He states he has his appendix. Denies dysuria, urinary frequency/urgency, or gross hematuria. Denies fever, chills. Pt does endorse some nausea that started when he arrived in the ED. Denies emesis. Denies diarrhea.   Past Medical History  Diagnosis Date  . Kidney stones 2011  . Right ureteral stone    Past Surgical History  Procedure Laterality Date  . Extracorporeal shock wave lithotripsy  08-11-2011    RIGHT   History reviewed. No pertinent family history. Social History  Substance Use Topics  . Smoking status: Never Smoker   . Smokeless tobacco: Never Used  . Alcohol Use: No    Review of Systems  All other systems reviewed and are negative.     Allergies  Penicillins  Home Medications   Prior to Admission medications   Medication Sig Start Date End Date Taking? Authorizing Provider  acetaminophen (TYLENOL) 500 MG tablet Take 500 mg by mouth every 6 (six) hours as needed. pain   Yes Historical Provider, MD  HYDROmorphone (DILAUDID) 2 MG tablet Take 2 mg by mouth every 4 (four) hours as needed for moderate pain or severe pain.    Yes  Historical Provider, MD  ibuprofen (ADVIL,MOTRIN) 200 MG tablet Take 400 mg by mouth every 6 (six) hours as needed. pain   Yes Historical Provider, MD  Multiple Vitamin (MULTIVITAMIN) tablet Take 1 tablet by mouth daily.   Yes Historical Provider, MD  Pseudoephedrine-Naproxen Na (SINUS & COLD-D PO) Take 1 tablet by mouth daily as needed (sinus pressure).   Yes Historical Provider, MD   BP 128/89 mmHg  Pulse 86  Temp(Src) 97.6 F (36.4 C) (Oral)  Resp 18  SpO2 99% Physical Exam  Constitutional: He is oriented to person, place, and time.  Appears uncomfortable but NAD  HENT:  Right Ear: External ear normal.  Left Ear: External ear normal.  Nose: Nose normal.  Mouth/Throat: Oropharynx is clear and moist. No oropharyngeal exudate.  Eyes: Conjunctivae and EOM are normal. Pupils are equal, round, and reactive to light.  Neck: Normal range of motion. Neck supple.  Cardiovascular: Normal rate, regular rhythm, normal heart sounds and intact distal pulses.   Pulmonary/Chest: Effort normal and breath sounds normal. No respiratory distress. He has no wheezes. He exhibits no tenderness.  Abdominal: Soft. Bowel sounds are normal. There is tenderness. There is no rigidity, no rebound and no guarding.    Diffuse tenderness along right side of abdomen. Negative Rovsing's, McBurney, rigidity, guarding, or rebound. Negative CVA tenderness.  Musculoskeletal: He exhibits no edema.  Neurological: He is alert and oriented to person, place, and time. No cranial nerve deficit.  Skin: Skin is warm and dry.  Psychiatric: He has a normal mood and affect.  Nursing note and vitals reviewed.   ED Course  Procedures (including critical care time) Labs Review Labs Reviewed  COMPREHENSIVE METABOLIC PANEL - Abnormal; Notable for the following:    Glucose, Bld 122 (*)    All other components within normal limits  URINALYSIS, ROUTINE W REFLEX MICROSCOPIC (NOT AT [facility]) - Abnormal; Notable for the following:     Color, Urine RED (*)    APPearance CLOUDY (*)    Hgb urine dipstick LARGE (*)    Protein, ur 30 (*)    Leukocytes, UA SMALL (*)    All other components within normal limits  URINE MICROSCOPIC-ADD ON - Abnormal; Notable for the following:    Squamous Epithelial / LPF 0-5 (*)    Bacteria, UA RARE (*)    Casts HYALINE CASTS (*)    All other components within normal limits  LIPASE, BLOOD  CBC    Imaging Review Ct Renal Stone Study  06/16/2015  CLINICAL DATA:  Right lower quadrant abdominal pain, history of kidney stones EXAM: CT ABDOMEN AND PELVIS WITHOUT CONTRAST TECHNIQUE: Multidetector CT imaging of the abdomen and pelvis was performed following the standard protocol without IV contrast. COMPARISON:  03/14/2011 FINDINGS: Lower chest: Mild dependent atelectasis in the bilateral lower lobes. Hepatobiliary: Unenhanced liver is unremarkable. Gallbladder is unremarkable. No intrahepatic or extrahepatic ductal dilatation. Pancreas: Within normal limits. Spleen: Within normal limits. Adrenals/Urinary Tract: Adrenal glands are within normal limits. 2 mm nonobstructing interpolar left renal calculus (series 2/ image 33). 3 mm nonobstructing right upper pole renal calculus (series 2/ image 27). 8 mm nonobstructing interpolar right renal calculus (series 2/ image 30). Mild right hydroureteronephrosis. 7 mm distal right ureteral calculus (coronal image 49). Bladder is within normal limits. Stomach/Bowel: Stomach is within normal limits. No evidence of bowel obstruction. Normal appendix (series 2/ image 49). Vascular/Lymphatic: No evidence of abdominal aortic aneurysm. Mild atherosclerotic calcifications of the abdominal aorta and branch vessels. No suspicious abdominopelvic lymphadenopathy. Reproductive: Prostate is unremarkable. Other: No abdominopelvic ascites. Musculoskeletal: Mild degenerative changes at L5-S1. IMPRESSION: 7 mm distal right ureteral calculus. Mild right hydroureteronephrosis. Bilateral  nonobstructing renal calculi, measuring up to 8 mm on the right. Electronically Signed   By: Charline BillsSriyesh  Krishnan M.D.   On: 06/16/2015 16:31   I have personally reviewed and evaluated these images and lab results as part of my medical decision-making.   EKG Interpretation None      MDM   Final diagnoses:  RLQ abdominal pain  Nephrolithiasis  Right ureteral stone    Ddx includes nephrolithiasis, appendicitis, cholecystitis. Given history of multiple kidney stones with exact same presentation, large hemoglobin in urine, no white count, no fever, diffuse right sided abdominal tenderness, I will get CT renal study. I have lower suspicion for other acute intra-abdominal etiology. Will give fentanyl, zofran, and 1L NS bolus.   CT shows y297mm distal right ureteral stone with bilateral nonobstructing renal stones. Pt is able to void comfortably. No evidence of infection. Pt's pain is well-controlled. He is not actively nauseated or vomiting. Will give rx for pain meds and flomax with outpatient urology f/u. ER return precautions given.   Carlene CoriaSerena Y Michiah Masse, PA-C 06/17/15 [id number]  Tilden FossaElizabeth Rees, MD 06/18/15 [id number]

## 2015-06-16 NOTE — Discharge Instructions (Signed)
Your CT scan showed evidence of multiple bilateral kidney stones with a right sided stone in your ureter that is likely causing your symptoms. There is no sign of infection or obstruction at this time. I will give you prescriptions for flomax and Percocet (pain medicine). Drink plenty of water. Please call Dr. Belva Crome office tomorrow morning to schedule outpatient follow-up appointment for further evaluation and management. Return to the ER for new or worsening symptoms.   Take medications as prescribed. Return to the emergency room for worsening condition or new concerning symptoms. Follow up with your regular doctor. If you don't have a regular doctor use one of the numbers below to establish a primary care doctor.   Emergency Department Resource Guide 1) Find a Doctor and Pay Out of Pocket Although you won't have to find out who is covered by your insurance plan, it is a good idea to ask around and get recommendations. You will then need to call the office and see if the doctor you have chosen will accept you as a new patient and what types of options they offer for patients who are self-pay. Some doctors offer discounts or will set up payment plans for their patients who do not have insurance, but you will need to ask so you aren't surprised when you get to your appointment.  2) Contact Your Local Health Department Not all health departments have doctors that can see patients for sick visits, but many do, so it is worth a call to see if yours does. If you don't know where your local health department is, you can check in your phone book. The CDC also has a tool to help you locate your state's health department, and many state websites also have listings of all of their local health departments.  3) Find a Walk-in Clinic If your illness is not likely to be very severe or complicated, you may want to try a walk in clinic. These are popping up all over the country in pharmacies, drugstores, and  shopping centers. They're usually staffed by nurse practitioners or physician assistants that have been trained to treat common illnesses and complaints. They're usually fairly quick and inexpensive. However, if you have serious medical issues or chronic medical problems, these are probably not your best option.  No Primary Care Doctor: - Call Health Connect at  [phone] - they can help you locate a primary care doctor that  accepts your insurance, provides certain services, etc. - Physician Referral [phone]  Emergency Department Resource Guide 1) Find a Doctor and Pay Out of Pocket Although you won't have to find out who is covered by your insurance plan, it is a good idea to ask around and get recommendations. You will then need to call the office and see if the doctor you have chosen will accept you as a new patient and what types of options they offer for patients who are self-pay. Some doctors offer discounts or will set up payment plans for their patients who do not have insurance, but you will need to ask so you aren't surprised when you get to your appointment.  2) Contact Your Local Health Department Not all health departments have doctors that can see patients for sick visits, but many do, so it is worth a call to see if yours does. If you don't know where your local health department is, you can check in your phone book. The CDC also has a tool to help you locate your state's health  department, and many state websites also have listings of all of their local health departments.  3) Find a Walk-in Clinic If your illness is not likely to be very severe or complicated, you may want to try a walk in clinic. These are popping up all over the country in pharmacies, drugstores, and shopping centers. They're usually staffed by nurse practitioners or physician assistants that have been trained to treat common illnesses and complaints. They're usually fairly quick and inexpensive.  However, if you have serious medical issues or chronic medical problems, these are probably not your best option.  No Primary Care Doctor: - Call Health Connect at  [phone] - they can help you locate a primary care doctor that  accepts your insurance, provides certain services, etc. - Physician Referral Service- [phone]  Chronic Pain Problems: Organization         Address  Phone   Notes  [facility]  [phone] Patients need to be referred by their primary care doctor.   Medication Assistance: Organization         Address  Phone   Notes  [facility] [address] [place], [place] [address] [phone] --Must be a resident of [facility] -- Must have NO insurance coverage whatsoever (no Medicaid/ Medicare, etc.) -- The pt. MUST have a primary care doctor that directs their care regularly and follows them in the community   [facility]  [phone]   [employer]  [phone]    Agencies that provide inexpensive medical care: Organization         Address  Phone   Notes  [facility]  [phone]   [facility]    [phone]   [facility] [address] [place], [place] [address] [phone]   [facility] [address] [place] [phone]   [facility]    [phone]   [facility]    [phone]   [facility]  [address], [place] Phone:  [phone], Fax:  [phone] Hours of Operation:  9 am - 6 pm, M-F.  Also accepts Medicaid/Medicare and self-pay.  [facility]  [address], [place] Phone: [phone], Fax: [phone]. Hours of Operation:  8:30 am - 5:30 pm, M-F.  Also accepts Medicaid and self-pay.  [facility] [place] [address], [place] Phone: [phone]   [facility] [address] Natasha Bence [place], [place] [phone], Ext. [phone] Mondays & Thursdays: 7-9 AM.  First 15 patients are seen on a first come, first serve basis.    Medicaid-accepting [facility] Providers:  Organization         Address  Phone   Notes  [facility] [address], [place] [phone] Also accepts self-pay patients.  [facility] [address] Laurell Josephs [place], [place]  [phone]   [facility] [address], [place] [phone]   [facility] [address], [place] [phone]   Renaye Rakers [address], [place]   [phone] Only accepts [place] patients after they have their name applied to their card.   Self-Pay (no insurance) in [facility]:  Organization  Address  Phone   Notes  Sickle Cell Patients, [facility] [address], [place] [phone]   [facility] [address], [place] [phone]) [phone]   [facility]  [address], [place] [phone]   [facility]/Dr. Osei-Bonsu  [address] [place] [address], [place] or [phone], [place] [phone] Phone number for both [place] and [place] locations is the same.  [facility] [address], [place] [phone]   [facility] [place] [address] [place] [address], [place] or [address] [phone]   [facility] [address], [place] [phone], phone; [phone], fax Sees patients 1st and 3rd Saturday of every month.  Must not qualify for public or private insurance (i.e. Medicaid, Medicare, [place] Health Choice, Veterans' Benefits)  Household income should be no more than 200% of the poverty level The clinic cannot treat you if you are pregnant or think  you are pregnant  Sexually transmitted diseases are not treated at the clinic.

## 2015-12-17 ENCOUNTER — Emergency Department (HOSPITAL_COMMUNITY): Payer: BLUE CROSS/BLUE SHIELD

## 2015-12-17 ENCOUNTER — Encounter (HOSPITAL_COMMUNITY): Payer: Self-pay

## 2015-12-17 ENCOUNTER — Emergency Department (HOSPITAL_COMMUNITY)
Admission: EM | Admit: 2015-12-17 | Discharge: 2015-12-18 | Disposition: A | Discharge status: Home / Self Care | Payer: BLUE CROSS/BLUE SHIELD | Attending: Emergency Medicine | Admitting: Emergency Medicine

## 2015-12-17 DIAGNOSIS — N390 Urinary tract infection, site not specified: Secondary | ICD-10-CM | POA: Diagnosis not present

## 2015-12-17 DIAGNOSIS — Z79899 Other long term (current) drug therapy: Secondary | ICD-10-CM | POA: Diagnosis not present

## 2015-12-17 DIAGNOSIS — N200 Calculus of kidney: Secondary | ICD-10-CM | POA: Insufficient documentation

## 2015-12-17 DIAGNOSIS — Z791 Long term (current) use of non-steroidal anti-inflammatories (NSAID): Secondary | ICD-10-CM | POA: Insufficient documentation

## 2015-12-17 DIAGNOSIS — R109 Unspecified abdominal pain: Secondary | ICD-10-CM | POA: Diagnosis present

## 2015-12-17 LAB — URINE MICROSCOPIC-ADD ON

## 2015-12-17 LAB — URINALYSIS, ROUTINE W REFLEX MICROSCOPIC
BILIRUBIN URINE: NEGATIVE
GLUCOSE, UA: NEGATIVE mg/dL
KETONES UR: NEGATIVE mg/dL
Leukocytes, UA: NEGATIVE
Nitrite: NEGATIVE
PROTEIN: 30 mg/dL — AB
Specific Gravity, Urine: 1.03 (ref 1.005–1.030)
pH: 7.5 (ref 5.0–8.0)

## 2015-12-17 MED ORDER — KETOROLAC TROMETHAMINE 30 MG/ML IJ SOLN
30.0000 mg | Freq: Once | INTRAMUSCULAR | Status: AC
  Administered 2015-12-17: 30 mg via INTRAVENOUS
  Filled 2015-12-17: qty 1

## 2015-12-17 MED ORDER — SODIUM CHLORIDE 0.9 % IV BOLUS (SEPSIS)
1000.0000 mL | Freq: Once | INTRAVENOUS | Status: AC
  Administered 2015-12-17: 1000 mL via INTRAVENOUS

## 2015-12-17 MED ORDER — ONDANSETRON HCL 4 MG/2ML IJ SOLN
4.0000 mg | Freq: Once | INTRAMUSCULAR | Status: AC
  Administered 2015-12-17: 4 mg via INTRAVENOUS
  Filled 2015-12-17: qty 2

## 2015-12-17 MED ORDER — HYDROMORPHONE HCL 1 MG/ML IJ SOLN
1.0000 mg | INTRAMUSCULAR | Status: DC | PRN
  Administered 2015-12-17: 1 mg via INTRAVENOUS
  Filled 2015-12-17: qty 1

## 2015-12-17 MED ORDER — OXYCODONE-ACETAMINOPHEN 5-325 MG PO TABS
1.0000 | ORAL_TABLET | ORAL | Status: DC | PRN
  Administered 2015-12-17: 1 via ORAL
  Filled 2015-12-17: qty 1

## 2015-12-17 NOTE — ED Notes (Signed)
Patient states that has been having right sided flank pain since about 7pm.  Patient states has a Hx of kidney stones and "feels like it usually does".  Patient states that was in to see PCP this week and was treated for inflamed prostate and given flomax, cipro, and naproxen.  Patient states that has had difficulty urinating and "feels like" he has to go often.  Patient states has taken his medications today without relief for pain.  Patient states took last pain med at 3:30 today.  Patient rates pain 9/10.

## 2015-12-18 MED ORDER — OXYCODONE-ACETAMINOPHEN 5-325 MG PO TABS: 2.0000 | ORAL_TABLET | ORAL | Status: DC | PRN

## 2015-12-18 MED ORDER — ONDANSETRON 4 MG PO TBDP: 4.0000 mg | ORAL_TABLET | Freq: Three times a day (TID) | ORAL | Status: DC | PRN

## 2015-12-18 MED ORDER — OXYCODONE-ACETAMINOPHEN 5-325 MG PO TABS
2.0000 | ORAL_TABLET | Freq: Once | ORAL | Status: AC
  Administered 2015-12-18: 2 via ORAL
  Filled 2015-12-18: qty 2

## 2015-12-18 MED ORDER — ONDANSETRON HCL 4 MG/2ML IJ SOLN
4.0000 mg | Freq: Once | INTRAMUSCULAR | Status: AC
Start: 2015-12-18 — End: 2015-12-18
  Administered 2015-12-18: 4 mg via INTRAVENOUS
  Filled 2015-12-18: qty 2

## 2015-12-18 MED ORDER — HYDROMORPHONE HCL 1 MG/ML IJ SOLN
1.0000 mg | Freq: Once | INTRAMUSCULAR | Status: AC
  Administered 2015-12-18: 1 mg via INTRAVENOUS
  Filled 2015-12-18: qty 1

## 2015-12-18 NOTE — Discharge Instructions (Signed)
Kidney Stones °Kidney stones (urolithiasis) are deposits that form inside your kidneys. The intense pain is caused by the stone moving through the urinary tract. When the stone moves, the ureter goes into spasm around the stone. The stone is usually passed in the urine.  °CAUSES  °· A disorder that makes certain neck glands produce too much parathyroid hormone (primary hyperparathyroidism). °· A buildup of uric acid crystals, similar to gout in your joints. °· Narrowing (stricture) of the ureter. °· A kidney obstruction present at birth (congenital obstruction). °· Previous surgery on the kidney or ureters. °· Numerous kidney infections. °SYMPTOMS  °· Feeling sick to your stomach (nauseous). °· Throwing up (vomiting). °· Blood in the urine (hematuria). °· Pain that usually spreads (radiates) to the groin. °· Frequency or urgency of urination. °DIAGNOSIS  °· Taking a history and physical exam. °· Blood or urine tests. °· CT scan. °· Occasionally, an examination of the inside of the urinary bladder (cystoscopy) is performed. °TREATMENT  °· Observation. °· Increasing your fluid intake. °· Extracorporeal shock wave lithotripsy--This is a noninvasive procedure that uses shock waves to break up kidney stones. °· Surgery may be needed if you have severe pain or persistent obstruction. There are various surgical procedures. Most of the procedures are performed with the use of small instruments. Only small incisions are needed to accommodate these instruments, so recovery time is minimized. °The size, location, and chemical composition are all important variables that will determine the proper choice of action for you. Talk to your health care provider to better understand your situation so that you will minimize the risk of injury to yourself and your kidney.  °HOME CARE INSTRUCTIONS  °· Drink enough water and fluids to keep your urine clear or pale yellow. This will help you to pass the stone or stone fragments. °· Strain  all urine through the provided strainer. Keep all particulate matter and stones for your health care provider to see. The stone causing the pain may be as small as a grain of salt. It is very important to use the strainer each and every time you pass your urine. The collection of your stone will allow your health care provider to analyze it and verify that a stone has actually passed. The stone analysis will often identify what you can do to reduce the incidence of recurrences. °· Only take over-the-counter or prescription medicines for pain, discomfort, or fever as directed by your health care provider. °· Keep all follow-up visits as told by your health care provider. This is important. °· Get follow-up X-rays if required. The absence of pain does not always mean that the stone has passed. It may have only stopped moving. If the urine remains completely obstructed, it can cause loss of kidney function or even complete destruction of the kidney. It is your responsibility to make sure X-rays and follow-ups are completed. Ultrasounds of the kidney can show blockages and the status of the kidney. Ultrasounds are not associated with any radiation and can be performed easily in a matter of minutes. °· Make changes to your daily diet as told by your health care provider. You may be told to: °¨ Limit the amount of salt that you eat. °¨ Eat 5 or more servings of fruits and vegetables each day. °¨ Limit the amount of meat, poultry, fish, and eggs that you eat. °· Collect a 24-hour urine sample as told by your health care provider. You may need to collect another urine sample every 6-12   months. °SEEK MEDICAL CARE IF: °· You experience pain that is progressive and unresponsive to any pain medicine you have been prescribed. °SEEK IMMEDIATE MEDICAL CARE IF:  °· Pain cannot be controlled with the prescribed medicine. °· You have a fever or shaking chills. °· The severity or intensity of pain increases over 18 hours and is not  relieved by pain medicine. °· You develop a new onset of abdominal pain. °· You feel faint or pass out. °· You are unable to urinate. °  °This information is not intended to replace advice given to you by your health care provider. Make sure you discuss any questions you have with your health care provider. °  °Document Released: 06/06/2005 Document Revised: 02/25/2015 Document Reviewed: 11/07/2012 °Elsevier Interactive Patient Education ©2016 [employer] ° °Kidney Stones °Kidney stones (urolithiasis) are deposits that form inside your kidneys. The intense pain is caused by the stone moving through the urinary tract. When the stone moves, the ureter goes into spasm around the stone. The stone is usually passed in the urine.  °CAUSES  °· A disorder that makes certain neck glands produce too much parathyroid hormone (primary hyperparathyroidism). °· A buildup of uric acid crystals, similar to gout in your joints. °· Narrowing (stricture) of the ureter. °· A kidney obstruction present at birth (congenital obstruction). °· Previous surgery on the kidney or ureters. °· Numerous kidney infections. °SYMPTOMS  °· Feeling sick to your stomach (nauseous). °· Throwing up (vomiting). °· Blood in the urine (hematuria). °· Pain that usually spreads (radiates) to the groin. °· Frequency or urgency of urination. °DIAGNOSIS  °· Taking a history and physical exam. °· Blood or urine tests. °· CT scan. °· Occasionally, an examination of the inside of the urinary bladder (cystoscopy) is performed. °TREATMENT  °· Observation. °· Increasing your fluid intake. °· Extracorporeal shock wave lithotripsy--This is a noninvasive procedure that uses shock waves to break up kidney stones. °· Surgery may be needed if you have severe pain or persistent obstruction. There are various surgical procedures. Most of the procedures are performed with the use of small instruments. Only small incisions are needed to accommodate these instruments, so  recovery time is minimized. °The size, location, and chemical composition are all important variables that will determine the proper choice of action for you. Talk to your health care provider to better understand your situation so that you will minimize the risk of injury to yourself and your kidney.  °HOME CARE INSTRUCTIONS  °· Drink enough water and fluids to keep your urine clear or pale yellow. This will help you to pass the stone or stone fragments. °· Strain all urine through the provided strainer. Keep all particulate matter and stones for your health care provider to see. The stone causing the pain may be as small as a grain of salt. It is very important to use the strainer each and every time you pass your urine. The collection of your stone will allow your health care provider to analyze it and verify that a stone has actually passed. The stone analysis will often identify what you can do to reduce the incidence of recurrences. °· Only take over-the-counter or prescription medicines for pain, discomfort, or fever as directed by your health care provider. °· Keep all follow-up visits as told by your health care provider. This is important. °· Get follow-up X-rays if required. The absence of pain does not always mean that the stone has passed. It may have only stopped moving. If   the urine remains completely obstructed, it can cause loss of kidney function or even complete destruction of the kidney. It is your responsibility to make sure X-rays and follow-ups are completed. Ultrasounds of the kidney can show blockages and the status of the kidney. Ultrasounds are not associated with any radiation and can be performed easily in a matter of minutes. °· Make changes to your daily diet as told by your health care provider. You may be told to: °¨ Limit the amount of salt that you eat. °¨ Eat 5 or more servings of fruits and vegetables each day. °¨ Limit the amount of meat, poultry, fish, and eggs that you  eat. °· Collect a 24-hour urine sample as told by your health care provider. You may need to collect another urine sample every 6-12 months. °SEEK MEDICAL CARE IF: °· You experience pain that is progressive and unresponsive to any pain medicine you have been prescribed. °SEEK IMMEDIATE MEDICAL CARE IF:  °· Pain cannot be controlled with the prescribed medicine. °· You have a fever or shaking chills. °· The severity or intensity of pain increases over 18 hours and is not relieved by pain medicine. °· You develop a new onset of abdominal pain. °· You feel faint or pass out. °· You are unable to urinate. °  °This information is not intended to replace advice given to you by your health care provider. Make sure you discuss any questions you have with your health care provider. °  °Document Released: 06/06/2005 Document Revised: 02/25/2015 Document Reviewed: 11/07/2012 °Elsevier Interactive Patient Education ©2016 [employer] ° °

## 2015-12-18 NOTE — ED Provider Notes (Signed)
CSN: [id number]     Arrival date & time 12/17/15  2040 History   First MD Initiated Contact with Patient 12/17/15 2308     Chief Complaint  Patient presents with  . Flank Pain      HPI  Patient presents for evaluation of right flank pain. History of kidney stones. Typical pain currently. Was seen by primary care physician this week for some dysuria. States he had a prostate exam and was told he had an enlarged prostate. States his urine sample showed "blood and infection". Was given naproxen, Cipro, and Flomax. Has been urinating frequently.  Has had previous lithotripsy and stone extractions.  Past Medical History  Diagnosis Date  . Kidney stones 2011  . Right ureteral stone    Past Surgical History  Procedure Laterality Date  . Extracorporeal shock wave lithotripsy  08-11-2011    RIGHT   No family history on file. Social History  Substance Use Topics  . Smoking status: Never Smoker   . Smokeless tobacco: Never Used  . Alcohol Use: No    Review of Systems  Constitutional: Negative for fever, chills, diaphoresis, appetite change and fatigue.  HENT: Negative for mouth sores, sore throat and trouble swallowing.   Eyes: Negative for visual disturbance.  Respiratory: Negative for cough, chest tightness, shortness of breath and wheezing.   Cardiovascular: Negative for chest pain.  Gastrointestinal: Positive for nausea and abdominal pain. Negative for vomiting, diarrhea and abdominal distention.  Endocrine: Negative for polydipsia, polyphagia and polyuria.  Genitourinary: Positive for urgency, frequency and flank pain. Negative for dysuria and hematuria.  Musculoskeletal: Negative for gait problem.  Skin: Negative for color change, pallor and rash.  Neurological: Negative for dizziness, syncope, light-headedness and headaches.  Hematological: Does not bruise/bleed easily.  Psychiatric/Behavioral: Negative for behavioral problems and confusion.      Allergies   Penicillins  Home Medications   Prior to Admission medications   Medication Sig Start Date End Date Taking? Authorizing Provider  acetaminophen (TYLENOL) 500 MG tablet Take 500 mg by mouth every 6 (six) hours as needed. pain   Yes Historical Provider, MD  ciprofloxacin (CIPRO) 500 MG tablet Take 500 mg by mouth 2 (two) times daily.   Yes Historical Provider, MD  ibuprofen (ADVIL,MOTRIN) 200 MG tablet Take 400 mg by mouth every 6 (six) hours as needed. pain   Yes Historical Provider, MD  naproxen (NAPROSYN) 500 MG tablet Take 500 mg by mouth 2 (two) times daily with a meal.   Yes Historical Provider, MD  tamsulosin (FLOMAX) 0.4 MG CAPS capsule Take 1 capsule (0.4 mg total) by mouth daily. 06/16/15  Yes Ace GinsSerena Y Sam, PA-C  ondansetron (ZOFRAN ODT) 4 MG disintegrating tablet Take 1 tablet (4 mg total) by mouth every 8 (eight) hours as needed for nausea. 12/18/15   Rolland PorterMark Amantha Sklar, MD  oxyCODONE-acetaminophen (PERCOCET/ROXICET) 5-325 MG tablet Take 2 tablets by mouth every 4 (four) hours as needed for severe pain. Patient not taking: Reported on 12/17/2015 06/16/15   Ace GinsSerena Y Sam, PA-C  oxyCODONE-acetaminophen (PERCOCET/ROXICET) 5-325 MG tablet Take 2 tablets by mouth every 4 (four) hours as needed. 12/18/15   Rolland PorterMark Syrianna Schillaci, MD   BP 135/95 mmHg  Pulse 74  Temp(Src) 98.3 F (36.8 C) (Oral)  Resp 15  Ht 6' (1.829 m)  Wt 200 lb (90.719 kg)  BMI 27.12 kg/m2  SpO2 97% Physical Exam  Constitutional: He is oriented to person, place, and time. He appears well-developed and well-nourished. No distress.  HENT:  Head:  Normocephalic.  Eyes: Conjunctivae are normal. Pupils are equal, round, and reactive to light. No scleral icterus.  Neck: Normal range of motion. Neck supple. No thyromegaly present.  Cardiovascular: Normal rate and regular rhythm.  Exam reveals no gallop and no friction rub.   No murmur heard. Pulmonary/Chest: Effort normal and breath sounds normal. No respiratory distress. He has no wheezes.  He has no rales.  Abdominal: Soft. Bowel sounds are normal. He exhibits no distension. There is no tenderness. There is no rebound.    Musculoskeletal: Normal range of motion.  Neurological: He is alert and oriented to person, place, and time.  Skin: Skin is warm and dry. No rash noted.  Psychiatric: He has a normal mood and affect. His behavior is normal.    ED Course  Procedures (including critical care time) Labs Review Labs Reviewed  URINALYSIS, ROUTINE W REFLEX MICROSCOPIC ([facility]) - Abnormal; Notable for the following:    APPearance TURBID (*)    Hgb urine dipstick MODERATE (*)    Protein, ur 30 (*)    All other components within normal limits  URINE MICROSCOPIC-ADD ON - Abnormal; Notable for the following:    Squamous Epithelial / LPF 0-5 (*)    Bacteria, UA FEW (*)    All other components within normal limits    Imaging Review Ct Renal Stone Study  12/18/2015  CLINICAL DATA:  Right-sided flank pain. EXAM: CT ABDOMEN AND PELVIS WITHOUT CONTRAST TECHNIQUE: Multidetector CT imaging of the abdomen and pelvis was performed following the standard protocol without IV contrast. COMPARISON:  June 16, 2015 FINDINGS: There is dependent atelectasis in the lung bases. The lung bases are otherwise within normal limits. No free air or free fluid. Two tiny stones are seen in the mid left kidney on coronal images with the largest measuring approximately 2 mm. No hydronephrosis on the left. Two stones are seen in the right kidney. The largest measures 7 mm on coronal image 87. There is moderate hydronephrosis on the right in addition to perinephric stranding. The right ureter is dilated along its length with a stone in the distal right ureter, just above the UVJ. This stone measures 6.5 mm by 3.2 mm and is approximately 1 cm above the bladder. No left ureterectasis or ureteral stones. Liver, gallbladder, spleen, pancreas, and adrenal glands are normal. Mild atherosclerotic change in the  abdominal aorta. No aneurysm or adenopathy. The stomach and small bowel are normal. The colon and appendix are normal. The pelvis demonstrates no mass or adenopathy. There is a distal right ureteral stone. The prostate and seminal vesicles are unremarkable. There are degenerative changes in the lower lumbar spine. No other significant bony abnormalities. IMPRESSION: 1. Obstructing 6.5 x 3.2 mm stone in the distal right ureter, just above the UVJ. Other stones are seen in both kidneys as described above. Electronically Signed   By: Gerome Samavid  Williams III M.D   On: 12/18/2015 00:41   I have personally reviewed and evaluated these images and lab results as part of my medical decision-making.   EKG Interpretation None      MDM   Final diagnoses:  Kidney stone  UTI (lower urinary tract infection)    Patient tolerating symptoms well. Is not febrile. Has a distal stone with hydronephrosis. Signs of infection but can tolerating well. Is on Cipro. Continue Flomax, Cipro, naproxen. Prescription for Percocet and Zofran. Urological follow-up if not improving. ER with acute changes including fever worsening pain refractory nausea or other new complaints.  Rolland Porter, MD 12/18/15 [phone]

## 2016-07-04 ENCOUNTER — Emergency Department (HOSPITAL_COMMUNITY): Payer: BLUE CROSS/BLUE SHIELD

## 2016-07-04 ENCOUNTER — Encounter (HOSPITAL_COMMUNITY): Payer: Self-pay | Admitting: Emergency Medicine

## 2016-07-04 ENCOUNTER — Emergency Department (HOSPITAL_COMMUNITY)
Admission: EM | Admit: 2016-07-04 | Discharge: 2016-07-05 | Disposition: A | Discharge status: Home / Self Care | Payer: BLUE CROSS/BLUE SHIELD | Attending: Emergency Medicine | Admitting: Emergency Medicine

## 2016-07-04 DIAGNOSIS — Z5181 Encounter for therapeutic drug level monitoring: Secondary | ICD-10-CM | POA: Insufficient documentation

## 2016-07-04 DIAGNOSIS — Z79899 Other long term (current) drug therapy: Secondary | ICD-10-CM | POA: Insufficient documentation

## 2016-07-04 DIAGNOSIS — R42 Dizziness and giddiness: Secondary | ICD-10-CM | POA: Diagnosis not present

## 2016-07-04 DIAGNOSIS — R51 Headache: Secondary | ICD-10-CM | POA: Diagnosis present

## 2016-07-04 DIAGNOSIS — G4459 Other complicated headache syndrome: Secondary | ICD-10-CM | POA: Diagnosis not present

## 2016-07-04 LAB — CBC
HCT: 39.8 % (ref 39.0–52.0)
Hemoglobin: 13.8 g/dL (ref 13.0–17.0)
MCH: 29.9 pg (ref 26.0–34.0)
MCHC: 34.7 g/dL (ref 30.0–36.0)
MCV: 86.3 fL (ref 78.0–100.0)
PLATELETS: 169 10*3/uL (ref 150–400)
RBC: 4.61 MIL/uL (ref 4.22–5.81)
RDW: 12.8 % (ref 11.5–15.5)
WBC: 6.8 10*3/uL (ref 4.0–10.5)

## 2016-07-04 LAB — DIFFERENTIAL
BASOS ABS: 0 10*3/uL (ref 0.0–0.1)
BASOS PCT: 0 %
Eosinophils Absolute: 0.2 10*3/uL (ref 0.0–0.7)
Eosinophils Relative: 3 %
Lymphocytes Relative: 29 %
Lymphs Abs: 2 10*3/uL (ref 0.7–4.0)
MONO ABS: 0.5 10*3/uL (ref 0.1–1.0)
Monocytes Relative: 7 %
NEUTROS ABS: 4.1 10*3/uL (ref 1.7–7.7)
NEUTROS PCT: 61 %

## 2016-07-04 MED ORDER — METOCLOPRAMIDE HCL 5 MG/ML IJ SOLN
10.0000 mg | Freq: Once | INTRAMUSCULAR | Status: AC
  Administered 2016-07-04: 10 mg via INTRAVENOUS
  Filled 2016-07-04: qty 2

## 2016-07-04 MED ORDER — DIPHENHYDRAMINE HCL 50 MG/ML IJ SOLN
25.0000 mg | Freq: Once | INTRAMUSCULAR | Status: AC
  Administered 2016-07-04: 25 mg via INTRAVENOUS
  Filled 2016-07-04: qty 1

## 2016-07-04 NOTE — ED Provider Notes (Signed)
[facility]-EMERGENCY DEPT Provider Note   CSN: [id number] Arrival date & time: 07/04/16  2202 By signing my name below, I, Charles Mccall, attest that this documentation has been prepared under the direction and in the presence of Charles Rhine, MD. Electronically Signed: Bridgette Mccall, ED Scribe. 07/04/16. 11:28 PM.  History   Chief Complaint Chief Complaint  Patient presents with  . Headache  . Dizziness   The history is provided by the patient and a relative. No language interpreter was used.  Headache   This is a new problem. The current episode started 3 to 5 hours ago. The problem occurs constantly. The problem has not changed since onset.The headache is associated with activity and bright light. The quality of the pain is described as throbbing. The pain is at a severity of 8/10. The pain is severe. The pain does not radiate. Pertinent negatives include no fever, no chest pressure, no nausea and no vomiting. He has tried nothing for the symptoms.   HPI Comments: Charles Mccall is a 51 y.o. male with no pertinent PMHx, who presents to the Emergency Department by EMS complaining of sudden onset, pressurized, 8/10 headache onset ~7:15 pm today. Pt also has associated intermittent dizziness which is reproducible with opening his eyes, and photophobia. Pt states he was at a meeting during the time of onset. Pt's family at bedside reports that pt seemed to have "trouble getting his words out earlier" but no slurred speech. His symptoms seem to worsen when ambulating and improve when pt has his eyes closed. Pt has family h/o aneurysms. Denies h/o similar symptoms. Denies any recent long travel. Pt further denies fever, chills, chest pain, abdominal pain, nausea, vomiting, or any other associated symptoms.  No focal weakness No syncope/seizure He had to be helped to ambulate due to severity of symptoms  Past Medical History:  Diagnosis Date  . Kidney stones 2011  . Right ureteral stone     Patient  Active Problem List   Diagnosis Date Noted  . Right ureteral stone 08/11/2011    Past Surgical History:  Procedure Laterality Date  . CARDIOVASCULAR STRESS TEST    . EXTRACORPOREAL SHOCK WAVE LITHOTRIPSY  08-11-2011   RIGHT     Home Medications    Prior to Admission medications   Medication Sig Start Date End Date Taking? Authorizing Provider  acetaminophen (TYLENOL) 500 MG tablet Take 500 mg by mouth every 6 (six) hours as needed. pain    Historical Provider, MD  ciprofloxacin (CIPRO) 500 MG tablet Take 500 mg by mouth 2 (two) times daily.    Historical Provider, MD  ibuprofen (ADVIL,MOTRIN) 200 MG tablet Take 400 mg by mouth every 6 (six) hours as needed. pain    Historical Provider, MD  naproxen (NAPROSYN) 500 MG tablet Take 500 mg by mouth 2 (two) times daily with a meal.    Historical Provider, MD  ondansetron (ZOFRAN ODT) 4 MG disintegrating tablet Take 1 tablet (4 mg total) by mouth every 8 (eight) hours as needed for nausea. 12/18/15   Rolland Porter, MD  oxyCODONE-acetaminophen (PERCOCET/ROXICET) 5-325 MG tablet Take 2 tablets by mouth every 4 (four) hours as needed. 12/18/15   Rolland Porter, MD  tamsulosin (FLOMAX) 0.4 MG CAPS capsule Take 1 capsule (0.4 mg total) by mouth daily. 06/16/15   [name], PA-C    Family History No family history on file.  Social History Social History  Substance Use Topics  . Smoking status: Never Smoker  . Smokeless tobacco: Never  Used  . Alcohol use No     Allergies   Penicillins   Review of Systems Review of Systems  Constitutional: Negative for chills and fever.  Eyes: Positive for photophobia.  Cardiovascular: Negative for chest pain.  Gastrointestinal: Negative for abdominal pain, nausea and vomiting.  Neurological: Positive for dizziness and headaches. Negative for seizures and syncope.  All other systems reviewed and are negative.  Physical Exam Updated Vital Signs BP 136/92 (BP Location: Right Arm)   Pulse 82   Temp  98.3 F (36.8 C) (Oral)   Resp 18   Ht 5\' 10"  (1.778 m)   Wt 205 lb (93 kg)   SpO2 99%   BMI 29.41 kg/m   Physical Exam CONSTITUTIONAL: Well developed/well nourished, uncomfortable appearing HEAD: Normocephalic/atraumatic EYES: EOMI/PERRL, no nystagmus, no ptosis ENMT: Mucous membranes moist NECK: supple no meningeal signs, no bruits SPINE/BACK:entire spine nontender CV: S1/S2 noted, no murmurs/rubs/gallops noted LUNGS: Lungs are clear to auscultation bilaterally, no apparent distress ABDOMEN: soft, nontender, no rebound or guarding NEURO:Awake/alert, face symmetric, no arm or leg drift is noted Equal 5/5 strength with shoulder abduction, elbow flex/extension, wrist flex/extension in upper extremities and equal hand grips bilaterally Equal 5/5 strength with hip flexion,knee flex/extension, foot dorsi/plantar flexion Cranial nerves 3/4/5/6/12/26/08/11/12 tested and intact No past pointing Sensation to light touch intact in all extremities EXTREMITIES: pulses normal, full ROM SKIN: warm, color normal PSYCH: no abnormalities of mood noted, alert and oriented to situation   ED Treatments / Results  DIAGNOSTIC STUDIES: Oxygen Saturation is 99% on RA, normal by my interpretation.    COORDINATION OF CARE: 11:27 PM Discussed treatment plan with pt at bedside which includes head CT and pt agreed to plan.  Labs (all labs ordered are listed, but only abnormal results are displayed) Labs Reviewed  COMPREHENSIVE METABOLIC PANEL - Abnormal; Notable for the following:       Result Value   Albumin 3.4 (*)    All other components within normal limits  ETHANOL  PROTIME-INR  APTT  CBC  DIFFERENTIAL    EKG  EKG Interpretation  Date/Time:  Monday July 04 2016 22:07:14 EST Ventricular Rate:  86 PR Interval:    QRS Duration: 108 QT Interval:  397 QTC Calculation: 475 R Axis:   61 Text Interpretation:  Sinus rhythm No significant change since last tracing Confirmed by Bebe ShaggyWICKLINE   MD, Sutton Plake ([id number]) on 07/04/2016 11:32:35 PM       Radiology Ct Head Wo Contrast  Result Date: 07/04/2016 CLINICAL DATA:  Sudden onset headache with light sensitivity EXAM: CT HEAD WITHOUT CONTRAST TECHNIQUE: Contiguous axial images were obtained from the base of the skull through the vertex without intravenous contrast. COMPARISON:  None. FINDINGS: Brain: No evidence of acute infarction, hemorrhage, hydrocephalus, extra-axial collection or mass lesion/mass effect. Vascular: No hyperdense vessel or unexpected calcification. Skull: Normal. Negative for fracture or focal lesion. Sinuses/Orbits: No acute finding. Other: None IMPRESSION: No CT evidence for acute intracranial abnormality Electronically Signed   By: Jasmine PangKim  Fujinaga M.D.   On: 07/04/2016 23:47    Procedures Procedures   Medications Ordered in ED Medications  metoCLOPramide (REGLAN) injection 10 mg (10 mg Intravenous Given 07/04/16 2351)  diphenhydrAMINE (BENADRYL) injection 25 mg (25 mg Intravenous Given 07/04/16 2351)     Initial Impression / Assessment and Plan / ED Course  I have reviewed the triage vital signs and the nursing notes.  Pertinent labs & imaging results that were available during my care of the patient were reviewed  by me and considered in my medical decision making (see chart for details).  Clinical Course     Pt stable Extensive workup unrevealing He had sudden onset of HA/dizziness.  There was some reported ?confusion and difficulty speaking but no focal/unilateral weakness reported, no dysarthria reported Due to family h/o aneurysms and his history of HA tonight (he does not usually have headaches) I advised further testing despite negative CT head including lumbar puncture to rule out occult SAH I discussed risk/benefits of performing LP After discussion with patient/family, he declines He is now improved He is ambulatory with steady gait He never had focal/unilateral stroke like symptoms Doubt  meningitis We discussed strict ER return precautions   Final Clinical Impressions(s) / ED Diagnoses   Final diagnoses:  Other complicated headache syndrome    New Prescriptions New Prescriptions   No medications on file   I personally performed the services described in this documentation, which was scribed in my presence. The recorded information has been reviewed and is accurate.        Charles Rhine, MD 07/05/16 [phone]

## 2016-07-04 NOTE — ED Triage Notes (Signed)
Pt st's he was at a meeting and developed a severe headache.  St's pain was all over his head.  St's he went to the restroom and developed dizziness.  St's head continues to hurt and when he opens his eyes he gets dizzy.  Pt denies nausea or vomiting.  Neuro exam neg at this time

## 2016-07-04 NOTE — ED Notes (Signed)
Pt taken to CT.

## 2016-07-05 LAB — COMPREHENSIVE METABOLIC PANEL
ALBUMIN: 3.4 g/dL — AB (ref 3.5–5.0)
ALK PHOS: 79 U/L (ref 38–126)
ALT: 32 U/L (ref 17–63)
AST: 26 U/L (ref 15–41)
Anion gap: 8 (ref 5–15)
BUN: 8 mg/dL (ref 6–20)
CALCIUM: 8.9 mg/dL (ref 8.9–10.3)
CHLORIDE: 104 mmol/L (ref 101–111)
CO2: 26 mmol/L (ref 22–32)
CREATININE: 0.96 mg/dL (ref 0.61–1.24)
GFR calc non Af Amer: >60 mL/min (ref >60)
GLUCOSE: 95 mg/dL (ref 65–99)
Potassium: 3.7 mmol/L (ref 3.5–5.1)
SODIUM: 138 mmol/L (ref 135–145)
Total Bilirubin: 0.6 mg/dL (ref 0.3–1.2)
Total Protein: 6.6 g/dL (ref 6.5–8.1)

## 2016-07-05 LAB — ETHANOL: Alcohol, Ethyl (B): <5 mg/dL (ref <5)

## 2016-07-05 LAB — PROTIME-INR
INR: 0.95
Prothrombin Time: 12.6 seconds (ref 11.4–15.2)

## 2016-07-05 LAB — APTT: APTT: 30 s (ref 24–36)

## 2016-07-05 NOTE — Discharge Instructions (Signed)

## 2016-07-05 NOTE — ED Notes (Signed)
Pt ambulatory with steady gait, reports feeling weakness in bilateral legs. Continues to have light sensitivity

## 2016-07-08 ENCOUNTER — Encounter: Payer: Self-pay | Admitting: Neurology

## 2016-07-08 ENCOUNTER — Ambulatory Visit (INDEPENDENT_AMBULATORY_CARE_PROVIDER_SITE_OTHER): Payer: BLUE CROSS/BLUE SHIELD | Admitting: Neurology

## 2016-07-08 DIAGNOSIS — G43C0 Periodic headache syndromes in child or adult, not intractable: Secondary | ICD-10-CM

## 2016-07-08 DIAGNOSIS — G43909 Migraine, unspecified, not intractable, without status migrainosus: Secondary | ICD-10-CM

## 2016-07-08 HISTORY — DX: Migraine, unspecified, not intractable, without status migrainosus: G43.909

## 2016-07-08 MED ORDER — RIZATRIPTAN BENZOATE 10 MG PO TBDP: 10.0000 mg | ORAL_TABLET | Freq: Three times a day (TID) | ORAL | 3 refills | Status: DC | PRN

## 2016-07-08 NOTE — Progress Notes (Signed)
Reason for visit: Headache  Referring physician: [place]  Tomasa HostellerRichard Longenecker is a 51 y.o. male  History of present illness:  Charles Mccall is a 51 year old right-handed white male with a history of onset of a headache that occurred on 07/04/2016. The patient reports only occasional mild headaches prior to this. The headache came on slowly, beginning around 7 PM on the day of the emergency room visit. Within an hour, the headache became very severe and was throughout the head associated with a squeezing sensation. The patient reported photophobia and phonophobia, he also had visual distortions as if he were looking through a kaleidoscope, but there were no colors. The patient felt weak in the legs, he also became somewhat confused with slurred speech. The patient was slightly agitated. He was brought to the emergency room, a CT scan of the brain was done and was unremarkable. A lumbar puncture was considered, but never done. The patient was given medications by vein which eventually helped the headache, but the headache lasted about 8 hours. He reported no nausea or vomiting. The patient has had one headache since being in the emergency room that occurred the day before this evaluation. This was less severe, unassociated with confusion. The patient took an Imitrex that his wife had, this did not help the headache. The patient reported no focal numbness or weakness of the face, arms, or legs, he did have generalized weakness. His mother and sister also have headache. He is sent to this office for an evaluation.  Past Medical History:  Diagnosis Date  . Heart disease   . Kidney stones 2011  . Right ureteral stone     Past Surgical History:  Procedure Laterality Date  . CARDIOVASCULAR STRESS TEST    . EXTRACORPOREAL SHOCK WAVE LITHOTRIPSY  08-11-2011   RIGHT    Family History  Problem Relation Age of Onset  . Alzheimer's disease Mother   . Brain cancer Father     Social history:  reports that  he has never smoked. He has never used smokeless tobacco. He reports that he does not drink alcohol or use drugs.  Medications:  Prior to Admission medications   Medication Sig Start Date End Date Taking? Authorizing Provider  Multiple Vitamin (ONE-A-DAY MENS PO) Take 1 tablet by mouth daily.   Yes Historical Provider, MD      Allergies  Allergen Reactions  . Penicillins Rash    Has patient had a PCN reaction causing immediate rash, facial/tongue/throat swelling, SOB or lightheadedness with hypotension:  Yes  Has patient had a PCN reaction causing severe rash involving mucus membranes or skin necrosis: no (pt did have itchy rash) Has patient had a PCN reaction that required hospitalization: no Has patient had a PCN reaction occurring within the last 10 years: no      ROS:  Out of a complete 14 system review of symptoms, the patient complains only of the following symptoms, and all other reviewed systems are negative.  Fatigue Dizziness Blurred vision, eye pain Snoring Headache, weakness, slurred speech  Blood pressure 131/80, pulse 78, height 5\' 10"  (1.778 m), weight 214 lb (97.1 kg).  Physical Exam  General: The patient is alert and cooperative at the time of the examination.  Eyes: Pupils are equal, round, and reactive to light. Discs are flat bilaterally.  Neck: The neck is supple, no carotid bruits are noted.  Respiratory: The respiratory examination is clear.  Cardiovascular: The cardiovascular examination reveals a regular rate and rhythm, no  obvious murmurs or rubs are noted.  Skin: Extremities are without significant edema.  Neurologic Exam  Mental status: The patient is alert and oriented x 3 at the time of the examination. The patient has apparent normal recent and remote memory, with an apparently normal attention span and concentration ability.  Cranial nerves: Facial symmetry is present. There is good sensation of the face to pinprick and soft touch  bilaterally. The strength of the facial muscles and the muscles to head turning and shoulder shrug are normal bilaterally. Speech is well enunciated, no aphasia or dysarthria is noted. Extraocular movements are full. Visual fields are full. The tongue is midline, and the patient has symmetric elevation of the soft palate. No obvious hearing deficits are noted.  Motor: The motor testing reveals 5 over 5 strength of all 4 extremities. Good symmetric motor tone is noted throughout.  Sensory: Sensory testing is intact to pinprick, soft touch, vibration sensation, and position sense on all 4 extremities. No evidence of extinction is noted.  Coordination: Cerebellar testing reveals good finger-nose-finger and heel-to-shin bilaterally.  Gait and station: Gait is normal. Tandem gait is normal. Romberg is negative. No drift is seen.  Reflexes: Deep tendon reflexes are symmetric and normal bilaterally. Toes are downgoing bilaterally.   Assessment/Plan:  1. Probable migraine headache  The patient had features very consistent with migraine with slow onset of headache, confusion, photophobia and phonophobia with the headache. The patient appears to have a family history of headache. The patient will be given Maxalt to take if needed for the headache, if the headache becomes more frequent, a daily prophylactic medication will be added. He will follow-up in about 4 months.   Marlan Palau MD 07/08/2016 9:29 AM  [facility] [address] [place], [place] [address]  Phone [phone] Fax [phone]

## 2016-11-07 ENCOUNTER — Ambulatory Visit (INDEPENDENT_AMBULATORY_CARE_PROVIDER_SITE_OTHER): Payer: BLUE CROSS/BLUE SHIELD | Admitting: Adult Health

## 2016-11-07 ENCOUNTER — Encounter: Payer: Self-pay | Admitting: Adult Health

## 2016-11-07 ENCOUNTER — Encounter (INDEPENDENT_AMBULATORY_CARE_PROVIDER_SITE_OTHER): Payer: Self-pay

## 2016-11-07 VITALS — BP 114/81 | HR 69 | Resp 16 | Ht 70.0 in | Wt 212.0 lb

## 2016-11-07 DIAGNOSIS — G43119 Migraine with aura, intractable, without status migrainosus: Secondary | ICD-10-CM

## 2016-11-07 MED ORDER — NORTRIPTYLINE HCL 10 MG PO CAPS: ORAL_CAPSULE | ORAL | 5 refills | Status: DC

## 2016-11-07 NOTE — Progress Notes (Signed)
I have read the note, and I agree with the clinical assessment and plan.  Jimma Ortman KEITH   

## 2016-11-07 NOTE — Progress Notes (Signed)
PATIENT: Charles Mccall DOB: Sep 05, 1965  REASON FOR VISIT: follow up- headache HISTORY FROM: patient  HISTORY OF PRESENT ILLNESS: Charles Mccall is a 51 year old male with a history of migraine headaches. He returns today for follow-up. He reports that his headache frequency has increased. He is having approximately 1-2 headaches a week. He does use the Maxalt and reports that it helps but never resolved his headache. He states on 2 occasions he had to take a second dose and he laid down and was able to go to sleep and when he awoke his headache was gone. His headaches usually occur across the forehead. He does have light and noise sensitivity. Denies nausea or vomiting. Denies any visual changes. Denies numbness or tingling in the extremities. He returns today for an evaluation.  HISTORY 07/08/16: Charles Mccall is a 51 year old right-handed white male with a history of onset of a headache that occurred on 07/04/2016. The patient reports only occasional mild headaches prior to this. The headache came on slowly, beginning around 7 PM on the day of the emergency room visit. Within an hour, the headache became very severe and was throughout the head associated with a squeezing sensation. The patient reported photophobia and phonophobia, he also had visual distortions as if he were looking through a kaleidoscope, but there were no colors. The patient felt weak in the legs, he also became somewhat confused with slurred speech. The patient was slightly agitated. He was brought to the emergency room, a CT scan of the brain was done and was unremarkable. A lumbar puncture was considered, but never done. The patient was given medications by vein which eventually helped the headache, but the headache lasted about 8 hours. He reported no nausea or vomiting. The patient has had one headache since being in the emergency room that occurred the day before this evaluation. This was less severe, unassociated with confusion. The  patient took an Imitrex that his wife had, this did not help the headache. The patient reported no focal numbness or weakness of the face, arms, or legs, he did have generalized weakness. His mother and sister also have headache. He is sent to this office for an evaluation.  REVIEW OF SYSTEMS: Out of a complete 14 system review of symptoms, the patient complains only of the following symptoms, and all other reviewed systems are negative.  Light sensitivity, headache  ALLERGIES: Allergies  Allergen Reactions  . Penicillins Rash        HOME MEDICATIONS: Outpatient Medications Prior to Visit  Medication Sig Dispense Refill  . Multiple Vitamin (ONE-A-DAY MENS PO) Take 1 tablet by mouth daily.    . rizatriptan (MAXALT-MLT) 10 MG disintegrating tablet Take 1 tablet (10 mg total) by mouth 3 (three) times daily as needed for migraine. 9 tablet 3   No facility-administered medications prior to visit.     PAST MEDICAL HISTORY: Past Medical History:  Diagnosis Date  . Heart disease   . Kidney stones 2011  . Migraine headache 07/08/2016  . Right ureteral stone     PAST SURGICAL HISTORY: Past Surgical History:  Procedure Laterality Date  . CARDIOVASCULAR STRESS TEST    . EXTRACORPOREAL SHOCK WAVE LITHOTRIPSY  08-11-2011   RIGHT    FAMILY HISTORY: Family History  Problem Relation Age of Onset  . Alzheimer's disease Mother   . Migraines Mother   . Brain cancer Father   . Migraines Sister     SOCIAL HISTORY: Social History   Social History  .  Marital status: Married    Spouse name: N/A  . Number of children: 2  . Years of education: 11   Occupational History  . [employer]    Social History Main Topics  . Smoking status: Never Smoker  . Smokeless tobacco: Never Used  . Alcohol use No  . Drug use: No  . Sexual activity: Not on file   Other Topics Concern  . Not on file   Social History Narrative   Lives at home w/ his family   Right-handed   Caffeine:  occasional      PHYSICAL EXAM  Vitals:   11/07/16 0726  BP: 114/81  Pulse: 69  Resp: 16  Weight: 212 lb (96.2 kg)  Height: 5\' 10"  (1.778 m)   Body mass index is 30.42 kg/m.  Generalized: Well developed, in no acute distress   Neurological examination  Mentation: Alert oriented to time, place, history taking. Follows all commands speech and language fluent Cranial nerve II-XII: Pupils were equal round reactive to light. Extraocular movements were full, visual field were full on confrontational test. Facial sensation and strength were normal. Uvula tongue midline. Head turning and shoulder shrug  were normal and symmetric. Motor: The motor testing reveals 5 over 5 strength of all 4 extremities. Good symmetric motor tone is noted throughout.  Sensory: Sensory testing is intact to soft touch on all 4 extremities. No evidence of extinction is noted.  Coordination: Cerebellar testing reveals good finger-nose-finger and heel-to-shin bilaterally.  Gait and station: Gait is normal. Tandem gait is normal. Romberg is negative. No drift is seen.  Reflexes: Deep tendon reflexes are symmetric and normal bilaterally.   DIAGNOSTIC DATA (LABS, IMAGING, TESTING) - I reviewed patient records, labs, notes, testing and imaging myself where available.  Lab Results  Component Value Date   WBC 6.8 07/04/2016   HGB 13.8 07/04/2016   HCT 39.8 07/04/2016   MCV 86.3 07/04/2016   PLT 169 07/04/2016      Component Value Date/Time   NA 138 07/04/2016 2207   K 3.7 07/04/2016 2207   CL 104 07/04/2016 2207   CO2 26 07/04/2016 2207   GLUCOSE 95 07/04/2016 2207   BUN 8 07/04/2016 2207   CREATININE 0.96 07/04/2016 2207   CALCIUM 8.9 07/04/2016 2207   PROT 6.6 07/04/2016 2207   ALBUMIN 3.4 (L) 07/04/2016 2207   AST 26 07/04/2016 2207   ALT 32 07/04/2016 2207   ALKPHOS 79 07/04/2016 2207   BILITOT 0.6 07/04/2016 2207   GFRNONAA >60 07/04/2016 2207   GFRAA >60 07/04/2016 2207      ASSESSMENT  AND PLAN 51 y.o. year old male  has a past medical history of Heart disease; Kidney stones (2011); Migraine headache (07/08/2016); and Right ureteral stone. here with:  1. Migraine headaches  The patient's headaches have increased. He is not a candidate for Topamax due to a history of kidney stones. We will try nortriptyline 10 mg at bedtime for 1 week then increase to 20 mg thereafter. I have reviewed side effects of nortriptyline with the patient. He denies any cardiac arrhythmias. He will continue using Maxalt as needed for acute migraine therapy. Patient is advised that if his symptoms worsen or he develops new symptoms he should let us know. He will follow-up in 4 months or sooner if needed.   Butch PennyMegan Susen Haskew, MSN, NP-C 11/07/2016, 7:56 AM [facility] [address], [address] [place], [place] [address] [phone]

## 2016-11-07 NOTE — Patient Instructions (Signed)
Continue Maxalt Start Nortriptyline 10 mg at bedtime for 1 week then increase to 20 mg at bedtime If your symptoms worsen or you develop new symptoms please let us know.   Nortriptyline capsules What is this medicine? NORTRIPTYLINE (nor TRIP ti leen) is used to treat depression. This medicine may be used for other purposes; ask your health care provider or pharmacist if you have questions. COMMON BRAND NAME(S): Aventyl, Pamelor What should I tell my health care provider before I take this medicine? They need to know if you have any of these conditions: -an alcohol problem -bipolar disorder or schizophrenia -difficulty passing urine, prostate trouble -glaucoma -heart disease or recent heart attack -liver disease -over active thyroid -seizures -thoughts or plans of suicide or a previous suicide attempt or family history of suicide attempt -an unusual or allergic reaction to nortriptyline, other medicines, foods, dyes, or preservatives -pregnant or trying to get pregnant -breast-feeding How should I use this medicine? Take this medicine by mouth with a glass of water. Follow the directions on the prescription label. Take your doses at regular intervals. Do not take it more often than directed. Do not stop taking this medicine suddenly except upon the advice of your doctor. Stopping this medicine too quickly may cause serious side effects or your condition may worsen. A special MedGuide will be given to you by the pharmacist with each prescription and refill. Be sure to read this information carefully each time. Talk to your pediatrician regarding the use of this medicine in children. Special care may be needed. Overdosage: If you think you have taken too much of this medicine contact a poison control center or emergency room at once. NOTE: This medicine is only for you. Do not share this medicine with others. What if I miss a dose? If you miss a dose, take it as soon as you can. If it is  almost time for your next dose, take only that dose. Do not take double or extra doses. What may interact with this medicine? Do not take this medicine with any of the following medications: -arsenic trioxide -certain medicines medicines for irregular heart beat -cisapride -halofantrine -linezolid -MAOIs like Carbex, Eldepryl, Marplan, Nardil, and Parnate -methylene blue (injected into a vein) -other medicines for mental depression -phenothiazines like perphenazine, thioridazine and chlorpromazine -pimozide -probucol -procarbazine -sparfloxacin -St. John's Wort -ziprasidone This medicine may also interact with any of the following medications: -atropine and related drugs like hyoscyamine, scopolamine, tolterodine and others -barbiturate medicines for inducing sleep or treating seizures, such as phenobarbital -cimetidine -medicines for diabetes -medicines for seizures like carbamazepine or phenytoin -reserpine -thyroid medicine This list may not describe all possible interactions. Give your health care provider a list of all the medicines, herbs, non-prescription drugs, or dietary supplements you use. Also tell them if you smoke, drink alcohol, or use illegal drugs. Some items may interact with your medicine. What should I watch for while using this medicine? Tell your doctor if your symptoms do not get better or if they get worse. Visit your doctor or health care professional for regular checks on your progress. Because it may take several weeks to see the full effects of this medicine, it is important to continue your treatment as prescribed by your doctor. Patients and their families should watch out for new or worsening thoughts of suicide or depression. Also watch out for sudden changes in feelings such as feeling anxious, agitated, panicky, irritable, hostile, aggressive, impulsive, severely restless, overly excited and hyperactive, or not  being able to sleep. If this happens,  especially at the beginning of treatment or after a change in dose, call your health care professional. Charles Mccall may get drowsy or dizzy. Do not drive, use machinery, or do anything that needs mental alertness until you know how this medicine affects you. Do not stand or sit up quickly, especially if you are an older patient. This reduces the risk of dizzy or fainting spells. Alcohol may interfere with the effect of this medicine. Avoid alcoholic drinks. Do not treat yourself for coughs, colds, or allergies without asking your doctor or health care professional for advice. Some ingredients can increase possible side effects. Your mouth may get dry. Chewing sugarless gum or sucking hard candy, and drinking plenty of water may help. Contact your doctor if the problem does not go away or is severe. This medicine may cause dry eyes and blurred vision. If you wear contact lenses you may feel some discomfort. Lubricating drops may help. See your eye doctor if the problem does not go away or is severe. This medicine can cause constipation. Try to have a bowel movement at least every 2 to 3 days. If you do not have a bowel movement for 3 days, call your doctor or health care professional. This medicine can make you more sensitive to the sun. Keep out of the sun. If you cannot avoid being in the sun, wear protective clothing and use sunscreen. Do not use sun lamps or tanning beds/booths. What side effects may I notice from receiving this medicine? Side effects that you should report to your doctor or health care professional as soon as possible: -allergic reactions like skin rash, itching or hives, swelling of the face, lips, or tongue -anxious -breathing problems -changes in vision -confusion -elevated mood, decreased need for sleep, racing thoughts, impulsive behavior -eye pain -fast, irregular heartbeat -feeling faint or lightheaded, falls -feeling agitated, angry, or irritable -fever with increased  sweating -hallucination, loss of contact with reality -seizures -stiff muscles -suicidal thoughts or other mood changes -tingling, pain, or numbness in the feet or hands -trouble passing urine or change in the amount of urine -trouble sleeping -unusually weak or tired -vomiting -yellowing of the eyes or skin Side effects that usually do not require medical attention (report to your doctor or health care professional if they continue or are bothersome): -change in sex drive or performance -change in appetite or weight -constipation -dizziness -dry mouth -nausea -tired -tremors -upset stomach This list may not describe all possible side effects. Call your doctor for medical advice about side effects. You may report side effects to FDA at 1-800-FDA-1088. Where should I keep my medicine? Keep out of the reach of children. Store at room temperature between 15 and 30 degrees C (59 and 86 degrees F). Keep container tightly closed. Throw away any unused medicine after the expiration date. NOTE: This sheet is a summary. It may not cover all possible information. If you have questions about this medicine, talk to your doctor, pharmacist, or health care provider.  2018 Elsevier/Gold Standard (2015-11-06 12:53:08)

## 2016-12-07 ENCOUNTER — Telehealth: Payer: Self-pay | Admitting: Adult Health

## 2016-12-07 MED ORDER — RIZATRIPTAN BENZOATE 10 MG PO TBDP: 10.0000 mg | ORAL_TABLET | Freq: Three times a day (TID) | ORAL | 3 refills | Status: DC | PRN

## 2016-12-07 NOTE — Telephone Encounter (Signed)
Spoke with pt and he is having headache pain in front area where he get migraines, but is different.  He is not having sinus congestion pressure, drainage.  He has not been consistent with taking the nortriptyline but is now at the 2 tabs po qhs.  He has not taken maxalt for this, as thought MM/NP told him to stop this, but noted that from last ofv note is ok to take for migraine.  He stated that had relative (i think brother) that had aneurysm, so is not keen on taking this a lot.  Any other reccs?

## 2016-12-07 NOTE — Telephone Encounter (Signed)
Pt asking to be  Called back to discuss head ache pain he is having in the front of his head, he is unsure if it is sinus or migraines.  Pt said this has been going on for about 2 days, if he sneezes or shakes head it worsens.  Please call

## 2016-12-07 NOTE — Addendum Note (Signed)
Addended by: [name], Radley Barto K on: 12/07/2016 12:00 PM   Modules accepted: Orders

## 2016-12-07 NOTE — Telephone Encounter (Signed)
I called patient. Over the last 2 days he has had increased headache, he is on nortriptyline but he has not been taking the Maxalt. The patient has a headache in the back of the head to around the eyes. The headache worsens if he shakes his head or bends over.  I will call in a prescription for Maxalt, the headache does not abate, we may need to give a trial on a prednisone Dosepak.

## 2017-01-15 ENCOUNTER — Emergency Department (HOSPITAL_BASED_OUTPATIENT_CLINIC_OR_DEPARTMENT_OTHER): Payer: BLUE CROSS/BLUE SHIELD

## 2017-01-15 ENCOUNTER — Encounter (HOSPITAL_BASED_OUTPATIENT_CLINIC_OR_DEPARTMENT_OTHER): Payer: Self-pay | Admitting: Emergency Medicine

## 2017-01-15 DIAGNOSIS — Y939 Activity, unspecified: Secondary | ICD-10-CM | POA: Insufficient documentation

## 2017-01-15 DIAGNOSIS — Y999 Unspecified external cause status: Secondary | ICD-10-CM | POA: Diagnosis not present

## 2017-01-15 DIAGNOSIS — S99921A Unspecified injury of right foot, initial encounter: Secondary | ICD-10-CM | POA: Diagnosis present

## 2017-01-15 DIAGNOSIS — S92324A Nondisplaced fracture of second metatarsal bone, right foot, initial encounter for closed fracture: Secondary | ICD-10-CM | POA: Diagnosis not present

## 2017-01-15 DIAGNOSIS — Z79899 Other long term (current) drug therapy: Secondary | ICD-10-CM | POA: Diagnosis not present

## 2017-01-15 DIAGNOSIS — S92334A Nondisplaced fracture of third metatarsal bone, right foot, initial encounter for closed fracture: Secondary | ICD-10-CM | POA: Insufficient documentation

## 2017-01-15 DIAGNOSIS — Y929 Unspecified place or not applicable: Secondary | ICD-10-CM | POA: Insufficient documentation

## 2017-01-15 DIAGNOSIS — W5519XA Other contact with horse, initial encounter: Secondary | ICD-10-CM | POA: Diagnosis not present

## 2017-01-15 NOTE — ED Triage Notes (Signed)
PT presents with c/o right foot pain after a house stepped on it tonight.

## 2017-01-16 ENCOUNTER — Emergency Department (HOSPITAL_BASED_OUTPATIENT_CLINIC_OR_DEPARTMENT_OTHER)
Admission: EM | Admit: 2017-01-16 | Discharge: 2017-01-16 | Disposition: A | Discharge status: Home / Self Care | Payer: BLUE CROSS/BLUE SHIELD | Attending: Emergency Medicine | Admitting: Emergency Medicine

## 2017-01-16 DIAGNOSIS — S92324A Nondisplaced fracture of second metatarsal bone, right foot, initial encounter for closed fracture: Secondary | ICD-10-CM

## 2017-01-16 DIAGNOSIS — S92334A Nondisplaced fracture of third metatarsal bone, right foot, initial encounter for closed fracture: Secondary | ICD-10-CM

## 2017-01-16 MED ORDER — IBUPROFEN 600 MG PO TABS: 600.0000 mg | ORAL_TABLET | Freq: Three times a day (TID) | ORAL | 0 refills | Status: AC | PRN

## 2017-01-16 NOTE — ED Notes (Signed)
PMS intact before and after. Pt tolerated well. All questions answered. 

## 2017-01-16 NOTE — ED Provider Notes (Signed)
[facility] Provider Note   CSN: [id number] Arrival date & time: 01/15/17  2311  By signing my name below, I, Charles Mccall, attest that this documentation has been prepared under the direction and in the presence of Charles Mccall, [name], MD. Electronically Signed: Karren CobbleNy'Kea Mccall, ED Scribe. 01/16/17. 12:51 AM.  History   Chief Complaint Chief Complaint  Patient presents with  . Foot Injury   The history is provided by the patient. No language interpreter was used.  Foot Injury   The incident occurred 6 to 12 hours ago. The incident occurred at home. The injury mechanism was a direct blow (horse stepped on his right foot). The pain is present in the right foot. He reports no foreign bodies present. The symptoms are aggravated by bearing weight and palpation. The treatment provided mild relief.   HPI HPI Comments: Charles Mccall is a 51 y.o. male who presents to the Emergency Department complaining of sudden onset, persistent, right foot pain that began at 5 pm after a horse stepped on his foot. He notes associated swelling. Pt reports he was out with horses when one of them stepped on his foot. He has been ambulating with difficulty. He took two Aleve with mild improvement of pain. Denies abdominal, chest pain, or back pain.   Past Medical History:  Diagnosis Date  . Heart disease   . Kidney stones 2011  . Migraine headache 07/08/2016  . Right ureteral stone    Patient Active Problem List   Diagnosis Date Noted  . Migraine headache 07/08/2016  . Right ureteral stone 08/11/2011   Past Surgical History:  Procedure Laterality Date  . CARDIOVASCULAR STRESS TEST    . EXTRACORPOREAL SHOCK WAVE LITHOTRIPSY  08-11-2011   RIGHT    Home Medications    Prior to Admission medications   Medication Sig Start Date End Date Taking? Authorizing Provider  Multiple Vitamin (ONE-A-DAY MENS PO) Take 1 tablet by mouth daily.    [provider]  nortriptyline (PAMELOR) 10 MG  capsule Take 1 capsule PO at bedtime for 1 week, then increase to 2 Capsules at bedtime 11/07/16   [name], [name], NP  rizatriptan (MAXALT-MLT) 10 MG disintegrating tablet Take 1 tablet (10 mg total) by mouth 3 (three) times daily as needed for migraine. 12/07/16   [name], Charles K, MD   Family History Family History  Problem Relation Age of Onset  . Alzheimer's disease Mother   . Migraines Mother   . Brain cancer Father   . Migraines Sister    Social History Social History  Substance Use Topics  . Smoking status: Never Smoker  . Smokeless tobacco: Never Used  . Alcohol use No   Allergies   Penicillins  Review of Systems Review of Systems  Cardiovascular: Negative for chest pain.  Gastrointestinal: Negative for abdominal pain.  Musculoskeletal: Positive for arthralgias. Negative for back pain.  All other systems reviewed and are negative.  Physical Exam Updated Vital Signs BP (!) 144/95 (BP Location: Left Arm)   Pulse 79   Temp 98.7 F (37.1 C) (Oral)   Resp 18   SpO2 100%   Physical Exam CONSTITUTIONAL: Well developed/well nourished HEAD: Normocephalic/atraumatic ENMT: Mucous membranes moist NECK: supple no meningeal signs SPINE/BACK:entire spine nontender CV: S1/S2 noted, no murmurs/rubs/gallops noted LUNGS: Lungs are clear to auscultation bilaterally, no apparent distress ABDOMEN: soft GU:no cva tenderness NEURO: Pt is awake/alert/appropriate, moves all extremitiesx4.  No facial droop.   EXTREMITIES: pulses normal/equal, full ROM, tenderness and swelling to dorsal  aspect of the right foot, no lacerations, pulses intact, no other extremity injury noted  SKIN: warm, color normal PSYCH: no abnormalities of mood noted, alert and oriented to situation  ED Treatments / Results  DIAGNOSTIC STUDIES: Oxygen Saturation is 100% on RA, normal by my interpretation.   COORDINATION OF CARE: 12:49 AM-Discussed next steps with pt. Pt verbalized understanding and is  agreeable with the plan.   Labs (all labs ordered are listed, but only abnormal results are displayed) Labs Reviewed - No data to display  EKG  EKG Interpretation None       Radiology Dg Foot Complete Right  Result Date: 01/16/2017 CLINICAL DATA:  51 year old male with trauma to the right foot. EXAM: RIGHT FOOT COMPLETE - 3+ VIEW COMPARISON:  None. FINDINGS: There is a nondisplaced appy fracture of the distal second metatarsal and a nondisplaced fracture of the mid third metatarsal. A no other acute fracture identified. There is no dislocation. There is soft tissue swelling of the midfoot. IMPRESSION: Nondisplaced fractures of the second and third metatarsals. Electronically Signed   By: Elgie CollardArash  Radparvar M.D.   On: 01/16/2017 00:08   Procedures Procedures  SPLINT APPLICATION Date/Time: 1:39 AM Authorized by: Joya GaskinsWICKLINE,Murel Wigle W Consent: Verbal consent obtained. Risks and benefits: risks, benefits and alternatives were discussed Consent given by: patient Splint applied by:  technician Location details: right foot Splint type: posterior Supplies used: ortho glass Post-procedure: The splinted body part was neurovascularly unchanged following the procedure. Patient tolerance: Patient tolerated the procedure well with no immediate complications.     Medications Ordered in ED Medications - No data to display  Initial Impression / Assessment and Plan / ED Course  I have reviewed the triage vital signs and the nursing notes.  Pertinent  imaging results that were available during my care of the patient were reviewed by me and considered in my medical decision making (see chart for details).     Pt declines vicodin Referred to ortho Pt agreeable   Final Clinical Impressions(s) / ED Diagnoses   Final diagnoses:  Closed nondisplaced fracture of second metatarsal bone of right foot, initial encounter  Closed nondisplaced fracture of third metatarsal bone of right foot, initial  encounter    New Prescriptions New Prescriptions   IBUPROFEN (ADVIL,MOTRIN) 600 MG TABLET    Take 1 tablet (600 mg total) by mouth every 8 (eight) hours as needed.  I personally performed the services described in this documentation, which was scribed in my presence. The recorded information has been reviewed and is accurate.        Charles Mccall, Yancarlos Berthold, MD 01/16/17 [phone]

## 2017-03-14 ENCOUNTER — Ambulatory Visit (INDEPENDENT_AMBULATORY_CARE_PROVIDER_SITE_OTHER): Payer: BLUE CROSS/BLUE SHIELD | Admitting: Adult Health

## 2017-03-14 ENCOUNTER — Encounter: Payer: Self-pay | Admitting: Adult Health

## 2017-03-14 ENCOUNTER — Encounter (INDEPENDENT_AMBULATORY_CARE_PROVIDER_SITE_OTHER): Payer: Self-pay

## 2017-03-14 VITALS — BP 124/82 | HR 66 | Wt 213.6 lb

## 2017-03-14 DIAGNOSIS — G43009 Migraine without aura, not intractable, without status migrainosus: Secondary | ICD-10-CM | POA: Diagnosis not present

## 2017-03-14 MED ORDER — NORTRIPTYLINE HCL 10 MG PO CAPS: 20.0000 mg | ORAL_CAPSULE | Freq: Every day | ORAL | 11 refills | Status: AC

## 2017-03-14 MED ORDER — RIZATRIPTAN BENZOATE 10 MG PO TBDP: 10.0000 mg | ORAL_TABLET | Freq: Three times a day (TID) | ORAL | 3 refills | Status: AC | PRN

## 2017-03-14 NOTE — Progress Notes (Signed)
PATIENT: Charles Mccall DOB: 10-28-1965  REASON FOR VISIT: follow up- migraine headaches HISTORY FROM: patient  HISTORY OF PRESENT ILLNESS: Today 03/14/17 Charles Mccall is a 51 year old male with a history of migraine headaches. He returns today for follow-up. He is currently on nortriptyline 20 mg at bedtime as well as Maxalt. He reports that his headaches are under relatively good control. He has approximately 1-2 headaches a month. He states that his headaches location varies. He has now been able to distinguish between a sinus headache and a migraine headache. He states that he does have photophobia but denies phonophobia and nausea and vomiting. Reports that Maxalt typically works well for him. He reports that he did have one of his horses step on his foot and break several bones. He reports this is getting better. He returns today for an evaluation.  HISTORY 11/07/16: Charles Mccall is a 51 year old male with a history of migraine headaches. He returns today for follow-up. He reports that his headache frequency has increased. He is having approximately 1-2 headaches a week. He does use the Maxalt and reports that it helps but never resolved his headache. He states on 2 occasions he had to take a second dose and he laid down and was able to go to sleep and when he awoke his headache was gone. His headaches usually occur across the forehead. He does have light and noise sensitivity. Denies nausea or vomiting. Denies any visual changes. Denies numbness or tingling in the extremities. He returns today for an evaluation.  HISTORY 07/08/16: Charles Mccall is a 51 year old right-handed white male with a history of onset of a headache that occurred on 07/04/2016. The patient reports only occasional mild headaches prior to this. The headache came on slowly, beginning around 7 PM on the day of the emergency room visit. Within an hour, the headache became very severe and was throughout the head associated with a  squeezing sensation. The patient reported photophobia and phonophobia, he also had visual distortions as if he were looking through a kaleidoscope, but there were no colors. The patient felt weak in the legs, he also became somewhat confused with slurred speech. The patient was slightly agitated. He was brought to the emergency room, a CT scan of the brain was done and was unremarkable. A lumbar puncture was considered, but never done. The patient was given medications by vein which eventually helped the headache, but the headache lasted about 8 hours. He reported no nausea or vomiting. The patient has had one headache since being in the emergency room that occurred the day before this evaluation. This was less severe, unassociated with confusion. The patient took an Imitrex that his wife had, this did not help the headache. The patient reported no focal numbness or weakness of the face, arms, or legs, he did have generalized weakness. His mother and sister also have headache. He is sent to this office for an evaluation.   REVIEW OF SYSTEMS: Out of a complete 14 system review of symptoms, the patient complains only of the following symptoms, and all other reviewed systems are negative.  See HPI  ALLERGIES: Allergies  Allergen Reactions  . Penicillins Rash    Has patient had a PCN reaction causing immediate rash, facial/tongue/throat swelling, SOB or lightheadedness with hypotension:  Yes  Has patient had a PCN reaction causing severe rash involving mucus membranes or skin necrosis: no (pt did have itchy rash) Has patient had a PCN reaction that required hospitalization: no Has patient  had a PCN reaction occurring within the last 10 years: no      HOME MEDICATIONS: Outpatient Medications Prior to Visit  Medication Sig Dispense Refill  . ibuprofen (ADVIL,MOTRIN) 600 MG tablet Take 1 tablet (600 mg total) by mouth every 8 (eight) hours as needed. 21 tablet 0  . Multiple Vitamin (ONE-A-DAY MENS  PO) Take 1 tablet by mouth daily.    . nortriptyline (PAMELOR) 10 MG capsule Take 1 capsule PO at bedtime for 1 week, then increase to 2 Capsules at bedtime 60 capsule 5  . rizatriptan (MAXALT-MLT) 10 MG disintegrating tablet Take 1 tablet (10 mg total) by mouth 3 (three) times daily as needed for migraine. 9 tablet 3   No facility-administered medications prior to visit.     PAST MEDICAL HISTORY: Past Medical History:  Diagnosis Date  . Heart disease   . Kidney stones 2011  . Migraine headache 07/08/2016  . Right ureteral stone     PAST SURGICAL HISTORY: Past Surgical History:  Procedure Laterality Date  . CARDIOVASCULAR STRESS TEST    . EXTRACORPOREAL SHOCK WAVE LITHOTRIPSY  08-11-2011   RIGHT    FAMILY HISTORY: Family History  Problem Relation Age of Onset  . Alzheimer's disease Mother   . Migraines Mother   . Brain cancer Father   . Migraines Sister     SOCIAL HISTORY: Social History   Social History  . Marital status: Married    Spouse name: N/A  . Number of children: 2  . Years of education: 11   Occupational History  . [employer]    Social History Main Topics  . Smoking status: Never Smoker  . Smokeless tobacco: Never Used  . Alcohol use No  . Drug use: No  . Sexual activity: Not on file   Other Topics Concern  . Not on file   Social History Narrative   Lives at home w/ his family   Right-handed   Caffeine: occasional      PHYSICAL EXAM  Vitals:   03/14/17 0802  BP: 124/82  Pulse: 66  Weight: 213 lb 9.6 oz (96.9 kg)   Body mass index is 30.65 kg/m.  Generalized: Well developed, in no acute distress   Neurological examination  Mentation: Alert oriented to time, place, history taking. Follows all commands speech and language fluent Cranial nerve II-XII: Pupils were equal round reactive to light. Extraocular movements were full, visual field were full on confrontational test. Facial sensation and strength were normal. Uvula tongue  midline. Head turning and shoulder shrug  were normal and symmetric. Motor: The motor testing reveals 5 over 5 strength of all 4 extremities. Good symmetric motor tone is noted throughout.  Sensory: Sensory testing is intact to soft touch on all 4 extremities. No evidence of extinction is noted.  Coordination: Cerebellar testing reveals good finger-nose-finger and heel-to-shin bilaterally.  Gait and station: Gait is normal. Tandem gait is normal. Romberg is negative. No drift is seen.  Reflexes: Deep tendon reflexes are symmetric and normal bilaterally.   DIAGNOSTIC DATA (LABS, IMAGING, TESTING) - I reviewed patient records, labs, notes, testing and imaging myself where available.  Lab Results  Component Value Date   WBC 6.8 07/04/2016   HGB 13.8 07/04/2016   HCT 39.8 07/04/2016   MCV 86.3 07/04/2016   PLT 169 07/04/2016      Component Value Date/Time   NA 138 07/04/2016 2207   K 3.7 07/04/2016 2207   CL 104 07/04/2016 2207   CO2  26 07/04/2016 2207   GLUCOSE 95 07/04/2016 2207   BUN 8 07/04/2016 2207   CREATININE 0.96 07/04/2016 2207   CALCIUM 8.9 07/04/2016 2207   PROT 6.6 07/04/2016 2207   ALBUMIN 3.4 (L) 07/04/2016 2207   AST 26 07/04/2016 2207   ALT 32 07/04/2016 2207   ALKPHOS 79 07/04/2016 2207   BILITOT 0.6 07/04/2016 2207   GFRNONAA >60 07/04/2016 2207   GFRAA >60 07/04/2016 2207      ASSESSMENT AND PLAN 51 y.o. year old male  has a past medical history of Heart disease; Kidney stones (2011); Migraine headache (07/08/2016); and Right ureteral stone. here with :  1. Migraine headaches  Overall the patient is doing well. He will continue on nortriptyline 20 mg at bedtime. He will continue to use Maxalt as needed for acute treatment. He is advised that if his headache frequency or severity increases he should let us know. He will follow-up in 6 months with Dr. Anne Hahn.  I spent 15 minutes with the patient. 50% of this time was spent discussing his migraine headaches  and treatment      Butch Penny, MSN, NP-C 03/14/2017, 7:53 AM [facility] [address] [place], [place] [address] [phone]

## 2017-03-14 NOTE — Progress Notes (Signed)
I have read the note, and I agree with the clinical assessment and plan.  Charles Mccall   

## 2017-03-14 NOTE — Patient Instructions (Signed)
Your Plan:  Continue nortriptyline 20 mg at bedtime Continue to use Maxalt if needed for acute treatment of headache  To prevent or relieve headaches, try the following: Cool Compress. Lie down and place a cool compress on your head.  Avoid headache triggers. If certain foods or odors seem to have triggered your migraines in the past, avoid them. A headache diary might help you identify triggers.  Include physical activity in your daily routine. Try a daily walk or other moderate aerobic exercise.  Manage stress. Find healthy ways to cope with the stressors, such as delegating tasks on your to-do list.  Practice relaxation techniques. Try deep breathing, yoga, massage and visualization.  Eat regularly. Eating regularly scheduled meals and maintaining a healthy diet might help prevent headaches. Also, drink plenty of fluids.  Follow a regular sleep schedule. Sleep deprivation might contribute to headaches Consider biofeedback. With this mind-body technique, you learn to control certain bodily functions - such as muscle tension, heart rate and blood pressure - to prevent headaches or reduce headache pain.    Proceed to emergency room if you experience new or worsening symptoms or symptoms do not resolve, if you have new neurologic symptoms or if headache is severe, or for any concerning symptom.   Thank you for coming to see [place] at [facility]. I hope we have been able to provide you high quality care today.  You may receive a patient satisfaction survey over the next few weeks. We would appreciate your feedback and comments so that we may continue to improve ourselves and the health of our patients.

## 2017-09-13 ENCOUNTER — Ambulatory Visit: Payer: BLUE CROSS/BLUE SHIELD | Admitting: Neurology

## 2017-09-13 ENCOUNTER — Telehealth: Payer: Self-pay | Admitting: Neurology

## 2017-09-13 ENCOUNTER — Encounter: Payer: Self-pay | Admitting: Neurology

## 2017-09-13 NOTE — Telephone Encounter (Signed)
This patient did not show for a revisit appointment today. 

## 2018-02-17 ENCOUNTER — Emergency Department (HOSPITAL_COMMUNITY): Payer: BLUE CROSS/BLUE SHIELD

## 2018-02-17 ENCOUNTER — Other Ambulatory Visit: Payer: Self-pay

## 2018-02-17 ENCOUNTER — Emergency Department (HOSPITAL_COMMUNITY)
Admission: EM | Admit: 2018-02-17 | Discharge: 2018-02-17 | Disposition: A | Discharge status: Home / Self Care | Payer: BLUE CROSS/BLUE SHIELD | Attending: Emergency Medicine | Admitting: Emergency Medicine

## 2018-02-17 ENCOUNTER — Encounter (HOSPITAL_COMMUNITY): Payer: Self-pay | Admitting: Emergency Medicine

## 2018-02-17 DIAGNOSIS — J9801 Acute bronchospasm: Secondary | ICD-10-CM | POA: Diagnosis not present

## 2018-02-17 DIAGNOSIS — Z79899 Other long term (current) drug therapy: Secondary | ICD-10-CM | POA: Diagnosis not present

## 2018-02-17 DIAGNOSIS — R0602 Shortness of breath: Secondary | ICD-10-CM | POA: Diagnosis present

## 2018-02-17 LAB — BASIC METABOLIC PANEL
Anion gap: 11 (ref 5–15)
BUN: 7 mg/dL (ref 6–20)
CALCIUM: 9.2 mg/dL (ref 8.9–10.3)
CO2: 21 mmol/L — AB (ref 22–32)
Chloride: 104 mmol/L (ref 98–111)
Creatinine, Ser: 0.9 mg/dL (ref 0.61–1.24)
GFR calc Af Amer: >60 mL/min (ref >60)
GFR calc non Af Amer: >60 mL/min (ref >60)
GLUCOSE: 192 mg/dL — AB (ref 70–99)
POTASSIUM: 4.3 mmol/L (ref 3.5–5.1)
Sodium: 136 mmol/L (ref 135–145)

## 2018-02-17 LAB — CBC
HCT: 45.5 % (ref 39.0–52.0)
Hemoglobin: 15.3 g/dL (ref 13.0–17.0)
MCH: 30.1 pg (ref 26.0–34.0)
MCHC: 33.6 g/dL (ref 30.0–36.0)
MCV: 89.4 fL (ref 78.0–100.0)
PLATELETS: 173 10*3/uL (ref 150–400)
RBC: 5.09 MIL/uL (ref 4.22–5.81)
RDW: 12 % (ref 11.5–15.5)
WBC: 16.8 10*3/uL — ABNORMAL HIGH (ref 4.0–10.5)

## 2018-02-17 LAB — I-STAT TROPONIN, ED: Troponin i, poc: 0 ng/mL (ref 0.00–0.08)

## 2018-02-17 MED ORDER — IOPAMIDOL (ISOVUE-370) INJECTION 76%
100.0000 mL | Freq: Once | INTRAVENOUS | Status: AC | PRN
  Administered 2018-02-17: 100 mL via INTRAVENOUS

## 2018-02-17 MED ORDER — IOPAMIDOL (ISOVUE-370) INJECTION 76%
INTRAVENOUS | Status: AC
  Filled 2018-02-17: qty 100

## 2018-02-17 MED ORDER — ALBUTEROL SULFATE HFA 108 (90 BASE) MCG/ACT IN AERS: 2.0000 | INHALATION_SPRAY | RESPIRATORY_TRACT | 0 refills | Status: AC | PRN

## 2018-02-17 MED ORDER — ALBUTEROL SULFATE HFA 108 (90 BASE) MCG/ACT IN AERS: 2.0000 | INHALATION_SPRAY | RESPIRATORY_TRACT | 0 refills | Status: DC | PRN

## 2018-02-17 MED ORDER — DOXYCYCLINE HYCLATE 100 MG PO CAPS: 100.0000 mg | ORAL_CAPSULE | Freq: Two times a day (BID) | ORAL | 0 refills | Status: DC

## 2018-02-17 NOTE — ED Notes (Addendum)
Pt states that he passes out every time during blood draw.  Pt xferred to fast track room #11.  Pt in bed lying in supine position during venipuncture.   Pt request cold rag.Marland Kitchen.after venipuncture pt was given cold wash cloth and remain in fast track room #11.  Triage nurse aware.

## 2018-02-17 NOTE — ED Triage Notes (Signed)
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  Since then he has been SOB, pressure in center of chest, and aching joints.  Called PCP and started on Prednisone Friday.  Reports coughing up bright red blood x 1 hour.

## 2018-02-17 NOTE — ED Provider Notes (Signed)
[facility] EMERGENCY DEPARTMENT Provider Note   CSN: [id number] Arrival date & time: 02/17/18  0125     History   Chief Complaint Chief Complaint  Patient presents with  . Shortness of Breath  . Chest Pain    HPI Charles Mccall is a 52 y.o. male.  Patient presents to the emergency department for evaluation of shortness of breath.  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  He reports that he did not have any mask or respiratory protection.  There were moldy soybeans in the bin.  Patient reports that after he came out of the bed and he felt slightly short of breath.  Over the course of yesterday he developed a cough.  Today he has been short of breath, experiencing pain when he breathes.  His doctor called in a prednisone pack for him which he started this evening.  Prior to coming to the ER, however, his cough became productive of blood.     Past Medical History:  Diagnosis Date  . Foot fracture   . Heart disease   . Kidney stones 2011  . Migraine headache 07/08/2016  . Right ureteral stone     Patient Active Problem List   Diagnosis Date Noted  . Migraine headache 07/08/2016  . Right ureteral stone 08/11/2011    Past Surgical History:  Procedure Laterality Date  . CARDIOVASCULAR STRESS TEST    . EXTRACORPOREAL SHOCK WAVE LITHOTRIPSY  08-11-2011   RIGHT        Home Medications    Prior to Admission medications   Medication Sig Start Date End Date Taking? Authorizing Provider  ibuprofen (ADVIL,MOTRIN) 600 MG tablet Take 1 tablet (600 mg total) by mouth every 8 (eight) hours as needed. Patient taking differently: Take 600 mg by mouth every 8 (eight) hours as needed for moderate pain.  01/16/17  Yes [name], MD  predniSONE (DELTASONE) 10 MG tablet Take 20-60 mg by mouth daily with breakfast. Take 6 tablets for 3 days 4 tablets for 3 days 2 tablets for 3 days    Yes [provider]  rizatriptan (MAXALT-MLT) 10 MG disintegrating tablet Take 1 tablet (10 mg total) by mouth 3 (three) times daily as needed for migraine. 03/14/17  Yes Butch Penny, NP  albuterol (PROVENTIL HFA;VENTOLIN HFA) 108 (90 Base) MCG/ACT inhaler Inhale 2 puffs into the lungs every 4 (four) hours as needed for wheezing or shortness of breath. 02/17/18   Gilda Crease, MD  doxycycline (VIBRAMYCIN) 100 MG capsule Take 1 capsule (100 mg total) by mouth 2 (two) times daily. 02/17/18   Gilda Crease, MD  nortriptyline (PAMELOR) 10 MG capsule Take 2 capsules (20 mg total) by mouth at bedtime. Patient not taking: Reported on 02/17/2018 03/14/17   Butch Penny, NP    Family History Family History  Problem Relation Age of Onset  . Alzheimer's disease Mother   . Migraines Mother   . Brain cancer Father   . Migraines Sister     Social History Social History   Tobacco Use  . Smoking status: Never Smoker  . Smokeless tobacco: Never Used  Substance Use Topics  . Alcohol use: No  . Drug use: No     Allergies   Penicillins   Review of Systems Review of Systems  Respiratory: Positive for cough, chest tightness and shortness of breath.   All other systems reviewed and are negative.  Physical Exam Updated Vital Signs BP 117/83   Pulse 74   Temp 98.2 F (36.8 C) (Oral)   Resp 20   SpO2 95%   Physical Exam  Constitutional: He is oriented to person, place, and time. He appears well-developed and well-nourished. No distress.  HENT:  Head: Normocephalic and atraumatic.  Right Ear: Hearing normal.  Left Ear: Hearing normal.  Nose: Nose normal.  Mouth/Throat: Oropharynx is clear and moist and mucous membranes are normal.  Eyes: Pupils are equal, round, and reactive to light. Conjunctivae and EOM are normal.  Neck: Normal range of motion. Neck supple.  Cardiovascular: Regular rhythm, S1 normal and S2 normal. Exam reveals no gallop and no friction rub.  No  murmur heard. Pulmonary/Chest: Effort normal. No respiratory distress. He has rales in the left lower field. He exhibits no tenderness.  Abdominal: Soft. Normal appearance and bowel sounds are normal. There is no hepatosplenomegaly. There is no tenderness. There is no rebound, no guarding, no tenderness at McBurney's point and negative Murphy's sign. No hernia.  Musculoskeletal: Normal range of motion.  Neurological: He is alert and oriented to person, place, and time. He has normal strength. No cranial nerve deficit or sensory deficit. Coordination normal. GCS eye subscore is 4. GCS verbal subscore is 5. GCS motor subscore is 6.  Skin: Skin is warm, dry and intact. No rash noted. No cyanosis.  Psychiatric: He has a normal mood and affect. His speech is normal and behavior is normal. Thought content normal.  Nursing note and vitals reviewed.    ED Treatments / Results  Labs (all labs ordered are listed, but only abnormal results are displayed) Labs Reviewed  BASIC METABOLIC PANEL - Abnormal; Notable for the following components:      Result Value   CO2 21 (*)    Glucose, Bld 192 (*)    All other components within normal limits  CBC - Abnormal; Notable for the following components:   WBC 16.8 (*)    All other components within normal limits  I-STAT TROPONIN, ED    EKG EKG Interpretation  Date/Time:  Saturday February 17 2018 01:30:11 EDT Ventricular Rate:  85 PR Interval:  146 QRS Duration: 98 QT Interval:  388 QTC Calculation: 461 R Axis:   -22 Text Interpretation:  Normal sinus rhythm with sinus arrhythmia Normal ECG Confirmed by Gilda Crease [phone]) on 02/17/2018 2:35:05 AM   Radiology Dg Chest 2 View  Result Date: 02/17/2018 CLINICAL DATA:  Patient with chest pressure.  Shortness of breath. EXAM: CHEST - 2 VIEW COMPARISON:  None. FINDINGS: Cardiac contours upper limits of normal. Heterogeneous opacities left lung base. No pleural effusion or pneumothorax. Regional  skeleton is unremarkable. IMPRESSION: Heterogeneous opacities left lung base may represent atelectasis or infection. Electronically Signed   By: Annia Belt M.D.   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 EXAM: CT ANGIOGRAPHY CHEST WITH CONTRAST TECHNIQUE: Multidetector CT imaging of the chest was performed using the standard protocol during bolus administration of intravenous contrast. Multiplanar CT image reconstructions and MIPs were obtained to evaluate the vascular anatomy. CONTRAST:  ISOVUE-370 IOPAMIDOL (ISOVUE-370) INJECTION 76% COMPARISON:  Chest radiograph February 17, 2018 FINDINGS: CARDIOVASCULAR: Adequate contrast opacification of the pulmonary artery's. Main pulmonary artery is not enlarged. No pulmonary arterial filling defects to the level of the subsegmental branches. Heart size is mildly enlarged.  No pericardial effusion. Thoracic aorta is normal course and caliber, unremarkable. MEDIASTINUM/NODES: No lymphadenopathy by CT size criteria. LUNGS/PLEURA: Tracheobronchial tree is patent, no pneumothorax. Mild bronchial wall thickening. No pleural effusions. Bibasilar in hand sing atelectasis. No focal consolidation. UPPER ABDOMEN: Non-acute.  Mild colonic diverticulosis. MUSCULOSKELETAL: Non-acute. Review of the MIP images confirms the above findings. IMPRESSION: 1. No acute pulmonary embolism. 2. Mild bronchitic changes seen with reactive airway disease or bronchitis. Bibasilar atelectasis. No pneumonia. 3. Mild cardiomegaly. Electronically Signed   By: Awilda Metroourtnay  Bloomer M.D.   On: 02/17/2018 04:56    Procedures Procedures (including critical care time)  Medications Ordered in ED Medications  iopamidol (ISOVUE-370) 76 % injection (has no administration in time range)  iopamidol (ISOVUE-370) 76 % injection 100 mL (100 mLs Intravenous Contrast Given 02/17/18 0413)      Initial Impression / Assessment and Plan / ED Course  I have reviewed the triage vital signs and the nursing notes.  Pertinent labs & imaging results that were available during my care of the patient were reviewed by me and considered in my medical decision making (see chart for details).     Patient presents to the emergency department for evaluation of cough, shortness of breath, hemoptysis.  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  Patient's vitals are unremarkable.  Initial x-ray was concerning for possible infiltrate.  With his hemoptysis, pleuritic discomfort, PE was also considered.  CT chest was performed.  No evidence of PE and there is no evidence of pneumonia.  Patient reassured, continue the course of prednisone prescribed by PCP.  Will add Levaquin and albuterol.  Final Clinical Impressions(s) / ED Diagnoses   Final diagnoses:  Bronchospasm    ED Discharge Orders         Ordered    albuterol (PROVENTIL HFA;VENTOLIN HFA) 108 (90 Base) MCG/ACT inhaler  Every 4 hours PRN     02/17/18 0634    doxycycline (VIBRAMYCIN) 100 MG capsule  2 times daily     02/17/18 0634           Gilda CreasePollina, Christopher J, MD 02/17/18 [phone]

## 2018-02-17 NOTE — ED Notes (Signed)
Pt to CT via stretcher

## 2018-09-09 ENCOUNTER — Encounter (HOSPITAL_COMMUNITY): Payer: Self-pay

## 2018-09-09 ENCOUNTER — Emergency Department (HOSPITAL_COMMUNITY): Payer: BLUE CROSS/BLUE SHIELD

## 2018-09-09 ENCOUNTER — Emergency Department (HOSPITAL_COMMUNITY)
Admission: EM | Admit: 2018-09-09 | Discharge: 2018-09-09 | Disposition: A | Discharge status: Home / Self Care | Payer: BLUE CROSS/BLUE SHIELD | Attending: Emergency Medicine | Admitting: Emergency Medicine

## 2018-09-09 ENCOUNTER — Other Ambulatory Visit: Payer: Self-pay

## 2018-09-09 DIAGNOSIS — N201 Calculus of ureter: Secondary | ICD-10-CM | POA: Diagnosis not present

## 2018-09-09 DIAGNOSIS — R1084 Generalized abdominal pain: Secondary | ICD-10-CM | POA: Diagnosis present

## 2018-09-09 LAB — CBC WITH DIFFERENTIAL/PLATELET
Abs Immature Granulocytes: 0.06 10*3/uL (ref 0.00–0.07)
BASOS ABS: 0 10*3/uL (ref 0.0–0.1)
Basophils Relative: 0 %
Eosinophils Absolute: 0 10*3/uL (ref 0.0–0.5)
Eosinophils Relative: 0 %
HCT: 42.2 % (ref 39.0–52.0)
Hemoglobin: 13.9 g/dL (ref 13.0–17.0)
Immature Granulocytes: 1 %
Lymphocytes Relative: 8 %
Lymphs Abs: 1 10*3/uL (ref 0.7–4.0)
MCH: 28.5 pg (ref 26.0–34.0)
MCHC: 32.9 g/dL (ref 30.0–36.0)
MCV: 86.7 fL (ref 80.0–100.0)
Monocytes Absolute: 1.1 10*3/uL — ABNORMAL HIGH (ref 0.1–1.0)
Monocytes Relative: 9 %
NEUTROS PCT: 82 %
NRBC: 0 % (ref 0.0–0.2)
Neutro Abs: 10 10*3/uL — ABNORMAL HIGH (ref 1.7–7.7)
Platelets: 162 10*3/uL (ref 150–400)
RBC: 4.87 MIL/uL (ref 4.22–5.81)
RDW: 12 % (ref 11.5–15.5)
WBC: 12.1 10*3/uL — ABNORMAL HIGH (ref 4.0–10.5)

## 2018-09-09 LAB — COMPREHENSIVE METABOLIC PANEL
ALBUMIN: 3.6 g/dL (ref 3.5–5.0)
ALT: 18 U/L (ref 0–44)
AST: 23 U/L (ref 15–41)
Alkaline Phosphatase: 74 U/L (ref 38–126)
Anion gap: 4 — ABNORMAL LOW (ref 5–15)
BUN: 13 mg/dL (ref 6–20)
CHLORIDE: 107 mmol/L (ref 98–111)
CO2: 25 mmol/L (ref 22–32)
Calcium: 8.7 mg/dL — ABNORMAL LOW (ref 8.9–10.3)
Creatinine, Ser: 1.37 mg/dL — ABNORMAL HIGH (ref 0.61–1.24)
GFR calc Af Amer: >60 mL/min (ref >60)
GFR calc non Af Amer: 60 mL/min — ABNORMAL LOW (ref >60)
Glucose, Bld: 113 mg/dL — ABNORMAL HIGH (ref 70–99)
Potassium: 4.1 mmol/L (ref 3.5–5.1)
Sodium: 136 mmol/L (ref 135–145)
Total Bilirubin: 0.9 mg/dL (ref 0.3–1.2)
Total Protein: 6.7 g/dL (ref 6.5–8.1)

## 2018-09-09 LAB — URINALYSIS, ROUTINE W REFLEX MICROSCOPIC
Bacteria, UA: NONE SEEN
Bilirubin Urine: NEGATIVE
Glucose, UA: NEGATIVE mg/dL
Ketones, ur: NEGATIVE mg/dL
Leukocytes,Ua: NEGATIVE
Nitrite: NEGATIVE
Protein, ur: NEGATIVE mg/dL
SPECIFIC GRAVITY, URINE: 1.029 (ref 1.005–1.030)
pH: 5 (ref 5.0–8.0)

## 2018-09-09 MED ORDER — KETOROLAC TROMETHAMINE 10 MG PO TABS: 10.0000 mg | ORAL_TABLET | Freq: Four times a day (QID) | ORAL | 0 refills | Status: AC | PRN

## 2018-09-09 MED ORDER — ONDANSETRON HCL 4 MG/2ML IJ SOLN
4.0000 mg | Freq: Once | INTRAMUSCULAR | Status: AC
  Administered 2018-09-09: 4 mg via INTRAVENOUS
  Filled 2018-09-09: qty 2

## 2018-09-09 MED ORDER — HYDROMORPHONE HCL 1 MG/ML IJ SOLN
1.0000 mg | Freq: Once | INTRAMUSCULAR | Status: AC
  Administered 2018-09-09: 1 mg via INTRAVENOUS
  Filled 2018-09-09: qty 1

## 2018-09-09 MED ORDER — TAMSULOSIN HCL 0.4 MG PO CAPS: 0.4000 mg | ORAL_CAPSULE | Freq: Two times a day (BID) | ORAL | 0 refills | Status: DC

## 2018-09-09 MED ORDER — HYDROCODONE-ACETAMINOPHEN 5-325 MG PO TABS: 1.0000 | ORAL_TABLET | Freq: Four times a day (QID) | ORAL | 0 refills | Status: AC | PRN

## 2018-09-09 MED ORDER — KETOROLAC TROMETHAMINE 30 MG/ML IJ SOLN
30.0000 mg | Freq: Once | INTRAMUSCULAR | Status: AC
  Administered 2018-09-09: 30 mg via INTRAVENOUS
  Filled 2018-09-09: qty 1

## 2018-09-09 NOTE — ED Provider Notes (Signed)
[facility] EMERGENCY DEPARTMENT Provider Note   CSN: [id number] Arrival date & time: 09/09/18  4585    History   Chief Complaint Chief Complaint  Patient presents with  . Abdominal Pain    HPI Charles Mccall is a 53 y.o. male.     HPI  The patient is a 53 year old male, he has a history of kidney stones in the past, most recently seen in the emergency department about 2-1/2 years ago when he presented with a history of right flank pain, was found to have an obstructing 6 x 3 mm stone in the distal right ureter.  He states that he is followed with urology over time and has required a couple of different interventional procedures to help remove stones including lithotripsy.  He states that his pain started several days ago, it started initially as an intermittent right pain more towards the right flank, the seem to go away but came back yesterday and was associated with some radiation into the right upper quadrant and then down into the right groin.  His urine has been darker yellow, he has vomited 3 times in 24 hours and his pain was severe enough that he called for paramedic transport.  They gave the patient 150 mcg of fentanyl and currently his pain is 2 out of 10.  He does not have any other surgical history of his abdomen including still having a gallbladder.  Past Medical History:  Diagnosis Date  . Foot fracture   . Heart disease   . Kidney stones 2011  . Migraine headache 07/08/2016  . Right ureteral stone     Patient Active Problem List   Diagnosis Date Noted  . Migraine headache 07/08/2016  . Right ureteral stone 08/11/2011    Past Surgical History:  Procedure Laterality Date  . CARDIOVASCULAR STRESS TEST    . EXTRACORPOREAL SHOCK WAVE LITHOTRIPSY  08-11-2011   RIGHT        Home Medications    Prior to Admission medications   Medication Sig Start Date End Date Taking? Authorizing Provider  albuterol (PROVENTIL HFA;VENTOLIN HFA) 108 (90  Base) MCG/ACT inhaler Inhale 2 puffs into the lungs every 4 (four) hours as needed for wheezing or shortness of breath. Patient not taking: Reported on 09/09/2018 02/17/18   Gilda Crease, MD  HYDROcodone-acetaminophen (NORCO/VICODIN) 5-325 MG tablet Take 1-2 tablets by mouth every 6 (six) hours as needed. 09/09/18   Eber Hong, MD  ibuprofen (ADVIL,MOTRIN) 600 MG tablet Take 1 tablet (600 mg total) by mouth every 8 (eight) hours as needed. Patient not taking: Reported on 09/09/2018 01/16/17   [name], MD  ketorolac (TORADOL) 10 MG tablet Take 1 tablet (10 mg total) by mouth every 6 (six) hours as needed for up to 2 days. 09/09/18 09/11/18  Eber Hong, MD  nortriptyline (PAMELOR) 10 MG capsule Take 2 capsules (20 mg total) by mouth at bedtime. Patient not taking: Reported on 02/17/2018 03/14/17   Butch Penny, NP  rizatriptan (MAXALT-MLT) 10 MG disintegrating tablet Take 1 tablet (10 mg total) by mouth 3 (three) times daily as needed for migraine. Patient not taking: Reported on 09/09/2018 03/14/17   Butch Penny, NP  tamsulosin (FLOMAX) 0.4 MG CAPS capsule Take 1 capsule (0.4 mg total) by mouth 2 (two) times daily. 09/09/18   Eber Hong, MD    Family History Family History  Problem Relation Age of Onset  . Alzheimer's disease Mother   . Migraines Mother   . Brain cancer  Father   . Migraines Sister     Social History Social History   Tobacco Use  . Smoking status: Never Smoker  . Smokeless tobacco: Never Used  Substance Use Topics  . Alcohol use: No  . Drug use: No     Allergies   Penicillins   Review of Systems Review of Systems  All other systems reviewed and are negative.    Physical Exam Updated Vital Signs BP 117/81   Pulse 71   Resp 13   Ht 1.778 m ( )   Wt 92.5 kg   SpO2 95%   BMI 29.27 kg/m   Physical Exam Vitals signs and nursing note reviewed.  Constitutional:      General: He is not in acute distress.    Appearance: He is  well-developed.  HENT:     Head: Normocephalic and atraumatic.     Mouth/Throat:     Pharynx: No oropharyngeal exudate.  Eyes:     General: No scleral icterus.       Right eye: No discharge.        Left eye: No discharge.     Conjunctiva/sclera: Conjunctivae normal.     Pupils: Pupils are equal, round, and reactive to light.  Neck:     Musculoskeletal: Normal range of motion and neck supple.     Thyroid: No thyromegaly.     Vascular: No JVD.  Cardiovascular:     Rate and Rhythm: Normal rate and regular rhythm.     Heart sounds: Normal heart sounds. No murmur. No friction rub. No gallop.   Pulmonary:     Effort: Pulmonary effort is normal. No respiratory distress.     Breath sounds: Normal breath sounds. No wheezing or rales.  Abdominal:     General: Bowel sounds are normal. There is no distension.     Palpations: Abdomen is soft. There is no mass.     Tenderness: There is abdominal tenderness ( Mild right upper quadrant tenderness, no Murphy sign, no CVA tenderness).  Musculoskeletal: Normal range of motion.        General: No tenderness.  Lymphadenopathy:     Cervical: No cervical adenopathy.  Skin:    General: Skin is warm and dry.     Findings: No erythema or rash.  Neurological:     Mental Status: He is alert.     Coordination: Coordination normal.  Psychiatric:        Behavior: Behavior normal.      ED Treatments / Results  Labs (all labs ordered are listed, but only abnormal results are displayed) Labs Reviewed  CBC WITH DIFFERENTIAL/PLATELET - Abnormal; Notable for the following components:      Result Value   WBC 12.1 (*)    Neutro Abs 10.0 (*)    Monocytes Absolute 1.1 (*)    All other components within normal limits  COMPREHENSIVE METABOLIC PANEL - Abnormal; Notable for the following components:   Glucose, Bld 113 (*)    Creatinine, Ser 1.37 (*)    Calcium 8.7 (*)    GFR calc non Af Amer 60 (*)    Anion gap 4 (*)    All other components within normal  limits  URINALYSIS, ROUTINE W REFLEX MICROSCOPIC - Abnormal; Notable for the following components:   Hgb urine dipstick SMALL (*)    All other components within normal limits    EKG None  Radiology Ct Renal Stone Study  Result Date: 09/09/2018 CLINICAL DATA:  Abdominal pain right  lower quadrant and vomiting 24 hours. EXAM: CT ABDOMEN AND PELVIS WITHOUT CONTRAST TECHNIQUE: Multidetector CT imaging of the abdomen and pelvis was performed following the standard protocol without IV contrast. COMPARISON:  12/17/2015 and 06/16/2015 FINDINGS: Lower chest: Minimal dependent bibasilar atelectasis. Hepatobiliary: Gallbladder, liver and biliary tree are normal. Pancreas: Normal. Spleen: Normal. Adrenals/Urinary Tract: Adrenal glands are normal. Kidneys are normal in size. Mild bilateral nephrolithiasis is present. There is mild to moderate right-sided hydronephrosis with dilatation of the right ureter as there is a 9 mm stone over the junction of the mid to distal right ureter. There is a tiny punctate stone immediately adjacent to this 9 mm stone in the right ureter. No left-sided hydronephrosis. Left ureter and bladder are normal. Stomach/Bowel: Stomach and small bowel are normal. Appendix is normal. Colon is normal. Vascular/Lymphatic: Minimal calcified plaque over the abdominal aorta. No adenopathy. Reproductive: Normal. Other: None. Musculoskeletal: Minimal degenerate change of the spine and hips. Intramuscular lipoma over the right abductor muscle group. IMPRESSION: Bilateral nephrolithiasis. 9 mm stone with tiny adjacent punctate stone over the junction of the mid to distal right ureter causing this low-grade obstruction. Aortic Atherosclerosis (ICD10-I70.0). Electronically Signed   By: Elberta Fortis M.D.   On: 09/09/2018 10:12    Procedures Procedures (including critical care time)  Medications Ordered in ED Medications  ketorolac (TORADOL) 30 MG/ML injection 30 mg (30 mg Intravenous Given 09/09/18  1005)  ondansetron (ZOFRAN) injection 4 mg (4 mg Intravenous Given 09/09/18 1003)  HYDROmorphone (DILAUDID) injection 1 mg (1 mg Intravenous Given 09/09/18 1007)     Initial Impression / Assessment and Plan / ED Course  I have reviewed the triage vital signs and the nursing notes.  Pertinent labs & imaging results that were available during my care of the patient were reviewed by me and considered in my medical decision making (see chart for details).  Clinical Course as of Sep 09 1346  Sun Sep 09, 2018  1041 I have personally viewed and interpreted the CT scan, there does appear to be a mid to distal right-sided ureteral stone measuring approximately 9 mm.  The patient's vital signs are unremarkable, he has tolerated the medication well, he will need to follow-up closely with urology.   [BM]  1310 The urinalysis reveals no signs of infection, there are some red blood cells.  There is a white blood cell count of 12,100 and a metabolic panel that shows a creatinine of 1.37.   [BM]  1310 The creatinine is slightly above what it is been in the past at 0.9.   [BM]  1344 With urology, he agrees that the patient can be seen in the office in the outpatient setting, NSAIDs, Flomax, stable for discharge.   [BM]    Clinical Course User Index [BM] Eber Hong, MD      At this time the patient is not vomiting, he appears more comfortable however I suspect that given his intermittent pain over the last couple of days that he likely has a recurrent kidney stone.  CT scan pending as the patient does have a history of large stones and will need to estimate the size to help with urology follow-up.  Will give Toradol, Dilaudid and repeat exam.  Urinalysis pending as well as renal function.  At the same time we will investigate possible other causes including cholecystitis as he does have some right upper quadrant tenderness.  Final Clinical Impressions(s) / ED Diagnoses   Final diagnoses:   Ureterolithiasis  ED Discharge Orders         Ordered    tamsulosin (FLOMAX) 0.4 MG CAPS capsule  2 times daily     09/09/18 1346    ketorolac (TORADOL) 10 MG tablet  Every 6 hours PRN     09/09/18 1346    HYDROcodone-acetaminophen (NORCO/VICODIN) 5-325 MG tablet  Every 6 hours PRN     09/09/18 1346           Eber Hong, MD 09/09/18 1348

## 2018-09-09 NOTE — ED Triage Notes (Signed)
Pt has abd pain in his RLQ, vomiting x3 in the last 24 hrs, denies chest pain or nausea at this time. Pt has Hx of kidney Stones, denies having any difficulty urinating &/or hematuria.

## 2018-09-09 NOTE — ED Notes (Signed)
Patient transported to CT 

## 2018-09-09 NOTE — Discharge Instructions (Signed)
Kidney stone is approximately 9 mm big, the pain has been controlled with medications here however you will need to have some medicines for home including the following   Toradol, 1 tablet every 6 hours as needed for pain, no more than 8 total tablets Hydrocodone, 1 to 2 tablets every 6 hours as needed for severe pain Flomax, 1 tablet twice a day for 5 days   If you have severe or worsening symptoms please return to the emergency department immediately

## 2020-11-26 ENCOUNTER — Other Ambulatory Visit: Payer: Self-pay

## 2020-11-26 ENCOUNTER — Emergency Department (HOSPITAL_BASED_OUTPATIENT_CLINIC_OR_DEPARTMENT_OTHER): Payer: BLUE CROSS/BLUE SHIELD

## 2020-11-26 ENCOUNTER — Emergency Department (HOSPITAL_BASED_OUTPATIENT_CLINIC_OR_DEPARTMENT_OTHER)
Admission: EM | Admit: 2020-11-26 | Discharge: 2020-11-27 | Disposition: A | Discharge status: Home / Self Care | Payer: BLUE CROSS/BLUE SHIELD | Attending: Emergency Medicine | Admitting: Emergency Medicine

## 2020-11-26 ENCOUNTER — Encounter (HOSPITAL_BASED_OUTPATIENT_CLINIC_OR_DEPARTMENT_OTHER): Payer: Self-pay

## 2020-11-26 DIAGNOSIS — N200 Calculus of kidney: Secondary | ICD-10-CM

## 2020-11-26 DIAGNOSIS — N201 Calculus of ureter: Secondary | ICD-10-CM | POA: Insufficient documentation

## 2020-11-26 DIAGNOSIS — R1012 Left upper quadrant pain: Secondary | ICD-10-CM | POA: Diagnosis present

## 2020-11-26 LAB — URINALYSIS, ROUTINE W REFLEX MICROSCOPIC
Bilirubin Urine: NEGATIVE
Glucose, UA: NEGATIVE mg/dL
Ketones, ur: NEGATIVE mg/dL
Leukocytes,Ua: NEGATIVE
Nitrite: NEGATIVE
RBC / HPF: >50 RBC/hpf — ABNORMAL HIGH (ref 0–5)
Specific Gravity, Urine: 1.027 (ref 1.005–1.030)
pH: 5.5 (ref 5.0–8.0)

## 2020-11-26 LAB — CBC WITH DIFFERENTIAL/PLATELET
Abs Immature Granulocytes: 0.02 10*3/uL (ref 0.00–0.07)
Basophils Absolute: 0.1 10*3/uL (ref 0.0–0.1)
Basophils Relative: 1 %
Eosinophils Absolute: 0.3 10*3/uL (ref 0.0–0.5)
Eosinophils Relative: 4 %
HCT: 40.6 % (ref 39.0–52.0)
Hemoglobin: 14.1 g/dL (ref 13.0–17.0)
Immature Granulocytes: 0 %
Lymphocytes Relative: 36 %
Lymphs Abs: 2.3 10*3/uL (ref 0.7–4.0)
MCH: 29.7 pg (ref 26.0–34.0)
MCHC: 34.7 g/dL (ref 30.0–36.0)
MCV: 85.5 fL (ref 80.0–100.0)
Monocytes Absolute: 0.7 10*3/uL (ref 0.1–1.0)
Monocytes Relative: 11 %
Neutro Abs: 3.1 10*3/uL (ref 1.7–7.7)
Neutrophils Relative %: 48 %
Platelets: 188 10*3/uL (ref 150–400)
RBC: 4.75 MIL/uL (ref 4.22–5.81)
RDW: 12.1 % (ref 11.5–15.5)
WBC: 6.4 10*3/uL (ref 4.0–10.5)
nRBC: 0 % (ref 0.0–0.2)

## 2020-11-26 LAB — BASIC METABOLIC PANEL
Anion gap: 9 (ref 5–15)
BUN: 10 mg/dL (ref 6–20)
CO2: 27 mmol/L (ref 22–32)
Calcium: 8.5 mg/dL — ABNORMAL LOW (ref 8.9–10.3)
Chloride: 104 mmol/L (ref 98–111)
Creatinine, Ser: 0.86 mg/dL (ref 0.61–1.24)
GFR, Estimated: >60 mL/min (ref >60)
Glucose, Bld: 93 mg/dL (ref 70–99)
Potassium: 3.7 mmol/L (ref 3.5–5.1)
Sodium: 140 mmol/L (ref 135–145)

## 2020-11-26 MED ORDER — MORPHINE SULFATE (PF) 4 MG/ML IV SOLN
4.0000 mg | Freq: Once | INTRAVENOUS | Status: AC
  Administered 2020-11-26: 4 mg via INTRAVENOUS
  Filled 2020-11-26: qty 1

## 2020-11-26 MED ORDER — KETOROLAC TROMETHAMINE 30 MG/ML IJ SOLN
30.0000 mg | Freq: Once | INTRAMUSCULAR | Status: AC
  Administered 2020-11-26: 30 mg via INTRAVENOUS
  Filled 2020-11-26: qty 1

## 2020-11-26 MED ORDER — ONDANSETRON HCL 4 MG/2ML IJ SOLN
4.0000 mg | Freq: Once | INTRAMUSCULAR | Status: AC
  Administered 2020-11-26: 4 mg via INTRAVENOUS
  Filled 2020-11-26: qty 2

## 2020-11-26 NOTE — ED Triage Notes (Signed)
Pt c/o LLQ pain that began 30 mins ago - has hx of kidney stones   Pain score 8/10

## 2020-11-26 NOTE — ED Provider Notes (Signed)
[facility] EMERGENCY DEPT Provider Note   CSN: [id number] Arrival date & time: 11/26/20  2225     History Chief Complaint  Patient presents with   Abdominal Pain    Charles Mccall is a 55 y.o. male.  Patient is a 55 year old male with history of kidney stones.  Patient presenting today for evaluation of left flank pain.  This has been occurring intermittently since yesterday.  This began in the absence of any injury or trauma.  He denies any bowel or bladder complaints.  He denies any fevers or chills.  The history is provided by the patient.  Abdominal Pain Pain location:  L flank Pain quality: cramping   Pain radiates to:  LLQ Pain severity:  Moderate Onset quality:  Sudden Duration:  1 day Timing:  Intermittent Progression:  Worsening Chronicity:  New Relieved by:  Nothing Worsened by:  Nothing Ineffective treatments:  None tried     Past Medical History:  Diagnosis Date   Foot fracture    Heart disease    Kidney stones 2011   Migraine headache 07/08/2016   Right ureteral stone     Patient Active Problem List   Diagnosis Date Noted   Migraine headache 07/08/2016   Right ureteral stone 08/11/2011    Past Surgical History:  Procedure Laterality Date   CARDIOVASCULAR STRESS TEST     EXTRACORPOREAL SHOCK WAVE LITHOTRIPSY  08-11-2011   RIGHT       Family History  Problem Relation Age of Onset   Alzheimer's disease Mother    Migraines Mother    Brain cancer Father    Migraines Sister     Social History   Tobacco Use   Smoking status: Never   Smokeless tobacco: Never  Substance Use Topics   Alcohol use: No   Drug use: No    Home Medications Prior to Admission medications   Medication Sig Start Date End Date Taking? Authorizing Provider  albuterol (PROVENTIL HFA;VENTOLIN HFA) 108 (90 Base) MCG/ACT inhaler Inhale 2 puffs into the lungs every 4 (four) hours as needed for wheezing or shortness of breath. Patient not taking: Reported  on 09/09/2018 02/17/18   Gilda Crease, MD  HYDROcodone-acetaminophen (NORCO/VICODIN) 5-325 MG tablet Take 1-2 tablets by mouth every 6 (six) hours as needed. 09/09/18   Eber Hong, MD  ibuprofen (ADVIL,MOTRIN) 600 MG tablet Take 1 tablet (600 mg total) by mouth every 8 (eight) hours as needed. Patient not taking: Reported on 09/09/2018 01/16/17   [name], MD  nortriptyline (PAMELOR) 10 MG capsule Take 2 capsules (20 mg total) by mouth at bedtime. Patient not taking: Reported on 02/17/2018 03/14/17   Butch Penny, NP  rizatriptan (MAXALT-MLT) 10 MG disintegrating tablet Take 1 tablet (10 mg total) by mouth 3 (three) times daily as needed for migraine. Patient not taking: Reported on 09/09/2018 03/14/17   Butch Penny, NP  tamsulosin (FLOMAX) 0.4 MG CAPS capsule Take 1 capsule (0.4 mg total) by mouth 2 (two) times daily. 09/09/18   Eber Hong, MD    Allergies    Penicillins  Review of Systems   Review of Systems  Gastrointestinal:  Positive for abdominal pain.  All other systems reviewed and are negative.  Physical Exam Updated Vital Signs BP (!) 163/111 (BP Location: Right Arm)   Pulse 92   Temp 98.6 F (37 C) (Oral)   Resp 20   Ht 5\' 10"  (1.778 m)   Wt 93 kg   SpO2 98%   BMI 29.41 kg/m  Physical Exam Vitals and nursing note reviewed.  Constitutional:      General: He is not in acute distress.    Appearance: He is well-developed. He is not diaphoretic.  HENT:     Head: Normocephalic and atraumatic.  Cardiovascular:     Rate and Rhythm: Normal rate and regular rhythm.     Heart sounds: No murmur heard.   No friction rub.  Pulmonary:     Effort: Pulmonary effort is normal. No respiratory distress.     Breath sounds: Normal breath sounds. No wheezing or rales.  Abdominal:     General: Bowel sounds are normal. There is no distension.     Palpations: Abdomen is soft.     Tenderness: There is abdominal tenderness in the left upper quadrant and left  lower quadrant. There is left CVA tenderness.  Musculoskeletal:        General: Normal range of motion.     Cervical back: Normal range of motion and neck supple.  Skin:    General: Skin is warm and dry.  Neurological:     Mental Status: He is alert and oriented to person, place, and time.     Coordination: Coordination normal.    ED Results / Procedures / Treatments   Labs (all labs ordered are listed, but only abnormal results are displayed) Labs Reviewed  URINALYSIS, ROUTINE W REFLEX MICROSCOPIC    EKG None  Radiology No results found.  Procedures Procedures   Medications Ordered in ED Medications  ondansetron (ZOFRAN) injection 4 mg (has no administration in time range)  ketorolac (TORADOL) 30 MG/ML injection 30 mg (has no administration in time range)  morphine 4 MG/ML injection 4 mg (has no administration in time range)    ED Course  I have reviewed the triage vital signs and the nursing notes.  Pertinent labs & imaging results that were available during my care of the patient were reviewed by me and considered in my medical decision making (see chart for details).    MDM Rules/Calculators/A&P  Patient presenting here with complaints of left flank pain intermittently for the past 2 days.  He has a history of prior kidney stones and this feels similar.  CT scan does show a minimally obstructive 3 mm left distal ureteral stone.  Patient feeling better after medications given here in the ER.  He is afebrile and there is no sign of infection.  Patient seems appropriate for discharge with pain medicine and Flomax.  To follow-up with urology if the stone has not passed in 3 to 4 days.  Final Clinical Impression(s) / ED Diagnoses Final diagnoses:  None    Rx / DC Orders ED Discharge Orders     None        Geoffery Lyons, MD 11/27/20 0151

## 2020-11-27 MED ORDER — TAMSULOSIN HCL 0.4 MG PO CAPS: 0.4000 mg | ORAL_CAPSULE | Freq: Every day | ORAL | 0 refills | Status: AC

## 2020-11-27 MED ORDER — OXYCODONE-ACETAMINOPHEN 5-325 MG PO TABS: 1.0000 | ORAL_TABLET | Freq: Four times a day (QID) | ORAL | 0 refills | Status: AC | PRN

## 2020-11-27 NOTE — Discharge Instructions (Addendum)
Begin taking Percocet as prescribed as needed for pain.  Begin taking Flomax as prescribed.  Follow-up with [facility] if symptoms are not improving in the next few days.  Their contact information has been provided in this discharge summary for you to call and make these arrangements.  Return to the emergency department if you develop worsening pain, high fever, or other new and concerning symptoms.

## 2021-02-18 ENCOUNTER — Emergency Department (HOSPITAL_BASED_OUTPATIENT_CLINIC_OR_DEPARTMENT_OTHER)
Admission: EM | Admit: 2021-02-18 | Discharge: 2021-02-18 | Disposition: A | Discharge status: Home / Self Care | Payer: BLUE CROSS/BLUE SHIELD | Attending: Emergency Medicine | Admitting: Emergency Medicine

## 2021-02-18 ENCOUNTER — Other Ambulatory Visit: Payer: Self-pay

## 2021-02-18 ENCOUNTER — Encounter (HOSPITAL_BASED_OUTPATIENT_CLINIC_OR_DEPARTMENT_OTHER): Payer: Self-pay | Admitting: Emergency Medicine

## 2021-02-18 DIAGNOSIS — X58XXXA Exposure to other specified factors, initial encounter: Secondary | ICD-10-CM | POA: Insufficient documentation

## 2021-02-18 DIAGNOSIS — T162XXA Foreign body in left ear, initial encounter: Secondary | ICD-10-CM | POA: Insufficient documentation

## 2021-02-18 MED ORDER — LIDOCAINE HCL URETHRAL/MUCOSAL 2 % EX GEL
1.0000 "application " | Freq: Once | CUTANEOUS | Status: AC
  Administered 2021-02-18: 1

## 2021-02-18 NOTE — ED Provider Notes (Signed)
[facility] EMERGENCY DEPT Provider Note   CSN: [id number] Arrival date & time: 02/18/21  1509     History Chief Complaint  Patient presents with   Foreign Body in Ear    Charles Mccall is a 55 y.o. male.  55 year old male who complains of foreign body sensation to left ear.  States that he felt an insect: To his ear canal.  Felt a fluttering type of sensation.  Has no other complaints      Past Medical History:  Diagnosis Date   Foot fracture    Heart disease    Kidney stones 2011   Migraine headache 07/08/2016   Right ureteral stone     Patient Active Problem List   Diagnosis Date Noted   Migraine headache 07/08/2016   Right ureteral stone 08/11/2011    Past Surgical History:  Procedure Laterality Date   CARDIOVASCULAR STRESS TEST     EXTRACORPOREAL SHOCK WAVE LITHOTRIPSY  08-11-2011   RIGHT       Family History  Problem Relation Age of Onset   Alzheimer's disease Mother    Migraines Mother    Brain cancer Father    Migraines Sister     Social History   Tobacco Use   Smoking status: Never   Smokeless tobacco: Never  Substance Use Topics   Alcohol use: No   Drug use: No    Home Medications Prior to Admission medications   Medication Sig Start Date End Date Taking? Authorizing Provider  albuterol (PROVENTIL HFA;VENTOLIN HFA) 108 (90 Base) MCG/ACT inhaler Inhale 2 puffs into the lungs every 4 (four) hours as needed for wheezing or shortness of breath. Patient not taking: Reported on 09/09/2018 02/17/18   Gilda Crease, MD  HYDROcodone-acetaminophen (NORCO/VICODIN) 5-325 MG tablet Take 1-2 tablets by mouth every 6 (six) hours as needed. 09/09/18   Eber Hong, MD  ibuprofen (ADVIL,MOTRIN) 600 MG tablet Take 1 tablet (600 mg total) by mouth every 8 (eight) hours as needed. Patient not taking: Reported on 09/09/2018 01/16/17   [name], MD  nortriptyline (PAMELOR) 10 MG capsule Take 2 capsules (20 mg total) by mouth at  bedtime. Patient not taking: Reported on 02/17/2018 03/14/17   Butch Penny, NP  oxyCODONE-acetaminophen (PERCOCET) 5-325 MG tablet Take 1-2 tablets by mouth every 6 (six) hours as needed. 11/27/20   Geoffery Lyons, MD  rizatriptan (MAXALT-MLT) 10 MG disintegrating tablet Take 1 tablet (10 mg total) by mouth 3 (three) times daily as needed for migraine. Patient not taking: Reported on 09/09/2018 03/14/17   Butch Penny, NP  tamsulosin (FLOMAX) 0.4 MG CAPS capsule Take 1 capsule (0.4 mg total) by mouth daily. 11/27/20   Geoffery Lyons, MD    Allergies    Penicillins  Review of Systems   Review of Systems  All other systems reviewed and are negative.  Physical Exam Updated Vital Signs BP (!) 123/96 (BP Location: Left Arm)   Pulse (!) 105   Temp 98.6 F (37 C)   Resp 18   Ht 1.778 m (5\' 10" )   Wt 95.3 kg   SpO2 98%   BMI 30.13 kg/m   Physical Exam Vitals and nursing note reviewed.  Constitutional:      Appearance: He is well-developed. He is not toxic-appearing.  HENT:     Head: Normocephalic and atraumatic.     Ears:     Comments: No foreign bodies appreciated in left or right ear canal Eyes:     Conjunctiva/sclera: Conjunctivae normal.  Pupils: Pupils are equal, round, and reactive to light.  Cardiovascular:     Rate and Rhythm: Normal rate.  Pulmonary:     Effort: Pulmonary effort is normal.  Musculoskeletal:     Cervical back: Normal range of motion.  Skin:    General: Skin is warm and dry.  Neurological:     Mental Status: He is alert and oriented to person, place, and time.  Prior to my seeing patient  ED Results / Procedures / Treatments   Labs (all labs ordered are listed, but only abnormal results are displayed) Labs Reviewed - No data to display  EKG None  Radiology No results found.  Procedures Procedures   Medications Ordered in ED Medications  lidocaine (XYLOCAINE) 2 % jelly 1 application (1 application Other Given 02/18/21 1552)    ED  Course  I have reviewed the triage vital signs and the nursing notes.  Pertinent labs & imaging results that were available during my care of the patient were reviewed by me and considered in my medical decision making (see chart for details).    MDM Rules/Calculators/A&P                           Prior to my seeing the patient, he had lidocaine jelly placed in his left ear canal by nursing.  They were able to extract insect.  I saw no residual foreign bodies.  Will discharge home Final Clinical Impression(s) / ED Diagnoses Final diagnoses:  None    Rx / DC Orders ED Discharge Orders     None        Lorre Nick, MD 02/18/21 1606

## 2021-02-18 NOTE — ED Notes (Signed)
RN utilized Runner, broadcasting/film/video wash with NT assistance to flush the left ear. Canal cleared of moth. Moth visualized intact in ear bowl. Patient reports relief of moth from ear

## 2021-02-18 NOTE — ED Notes (Signed)
Lidocaine gel applied to left ear, 2nd time.

## 2021-02-18 NOTE — ED Triage Notes (Signed)
Insect in left ear since this afternoon, still fluttering

## 2022-09-08 ENCOUNTER — Other Ambulatory Visit: Payer: Self-pay

## 2022-09-08 ENCOUNTER — Emergency Department (HOSPITAL_BASED_OUTPATIENT_CLINIC_OR_DEPARTMENT_OTHER): Payer: BLUE CROSS/BLUE SHIELD | Admitting: Radiology

## 2022-09-08 ENCOUNTER — Encounter (HOSPITAL_BASED_OUTPATIENT_CLINIC_OR_DEPARTMENT_OTHER): Payer: Self-pay

## 2022-09-08 ENCOUNTER — Emergency Department (HOSPITAL_BASED_OUTPATIENT_CLINIC_OR_DEPARTMENT_OTHER)
Admission: EM | Admit: 2022-09-08 | Discharge: 2022-09-09 | Disposition: A | Discharge status: Home / Self Care | Payer: BLUE CROSS/BLUE SHIELD | Attending: Emergency Medicine | Admitting: Emergency Medicine

## 2022-09-08 DIAGNOSIS — R0602 Shortness of breath: Secondary | ICD-10-CM | POA: Insufficient documentation

## 2022-09-08 DIAGNOSIS — R079 Chest pain, unspecified: Secondary | ICD-10-CM | POA: Diagnosis present

## 2022-09-08 LAB — CBC
HCT: 42.3 % (ref 39.0–52.0)
Hemoglobin: 14.7 g/dL (ref 13.0–17.0)
MCH: 29.5 pg (ref 26.0–34.0)
MCHC: 34.8 g/dL (ref 30.0–36.0)
MCV: 84.8 fL (ref 80.0–100.0)
Platelets: 179 10*3/uL (ref 150–400)
RBC: 4.99 MIL/uL (ref 4.22–5.81)
RDW: 12.4 % (ref 11.5–15.5)
WBC: 8.2 10*3/uL (ref 4.0–10.5)
nRBC: 0 % (ref 0.0–0.2)

## 2022-09-08 LAB — TROPONIN I (HIGH SENSITIVITY): Troponin I (High Sensitivity): <2 ng/L (ref <18)

## 2022-09-08 NOTE — ED Notes (Signed)
Patient transported to X-ray 

## 2022-09-08 NOTE — ED Triage Notes (Signed)
Patient here POV from Home.  Endorses Mid Chest Pain that began 1 Week ago. Worsened more so today. Associated with Some SOB.   NAD Noted during Triage. A&Ox4. GCS 15. Ambulatory.

## 2022-09-09 LAB — BASIC METABOLIC PANEL
Anion gap: 6 (ref 5–15)
BUN: 9 mg/dL (ref 6–20)
CO2: 29 mmol/L (ref 22–32)
Calcium: 9.2 mg/dL (ref 8.9–10.3)
Chloride: 104 mmol/L (ref 98–111)
Creatinine, Ser: 0.98 mg/dL (ref 0.61–1.24)
GFR, Estimated: >60 mL/min (ref >60)
Glucose, Bld: 98 mg/dL (ref 70–99)
Potassium: 3.8 mmol/L (ref 3.5–5.1)
Sodium: 139 mmol/L (ref 135–145)

## 2022-09-09 NOTE — Discharge Instructions (Signed)
You were evaluated in the Emergency Department and after careful evaluation, we did not find any emergent condition requiring admission or further testing in the hospital.  Your exam/testing today is overall reassuring.  Recommend follow-up with your primary care doctor and/or cardiology to discuss your symptoms.  Please return to the Emergency Department if you experience any worsening of your condition.   Thank you for allowing [place] to be a part of your care.

## 2022-09-09 NOTE — ED Provider Notes (Signed)
[facility] Emergency Department Provider Note MRN:  [id number]  Arrival date & time: 09/09/22     Chief Complaint   Chest Pain   History of Present Illness   Charles Mccall is a 57 y.o. year-old male with no pertinent past medical history presenting to the ED with chief complaint of chest pain.  Intermittent chest pain over the past 5 days.  Very busy week, working a lot, lots of manual labor.  A bit stressful as well, had to take a young child to the hospital for an injury.  Some mild shortness of breath with the pain.  Pain not currently present.  Review of Systems  A thorough review of systems was obtained and all systems are negative except as noted in the HPI and PMH.   Patient's Health History    Past Medical History:  Diagnosis Date   Foot fracture    Heart disease    Kidney stones 2011   Migraine headache 07/08/2016   Right ureteral stone     Past Surgical History:  Procedure Laterality Date   CARDIOVASCULAR STRESS TEST     EXTRACORPOREAL SHOCK WAVE LITHOTRIPSY  08-11-2011   RIGHT    Family History  Problem Relation Age of Onset   Alzheimer's disease Mother    Migraines Mother    Brain cancer Father    Migraines Sister     Social History   Socioeconomic History   Marital status: Married    Spouse name: Not on file   Number of children: 2   Years of education: 11   Highest education level: Not on file  Occupational History   Occupation: International aid/development worker  Tobacco Use   Smoking status: Never   Smokeless tobacco: Never  Substance and Sexual Activity   Alcohol use: No   Drug use: No   Sexual activity: Not on file  Other Topics Concern   Not on file  Social History Narrative   Lives at home w/ his family   Right-handed   Caffeine: occasional   Social Determinants of Health   Financial Resource Strain: Not on file  Food Insecurity: Not on file  Transportation Needs: Not on file  Physical Activity: Not on file  Stress: Not on  file  Social Connections: Not on file  Intimate Partner Violence: Not on file     Physical Exam   Vitals:   09/09/22 0015 09/09/22 0030  BP:  (!) 138/99  Pulse: 81 71  Resp: 16 12  Temp:    SpO2: 96% 98%    CONSTITUTIONAL: Well-appearing, NAD NEURO/PSYCH:  Alert and oriented x 3, no focal deficits EYES:  eyes equal and reactive ENT/NECK:  no LAD, no JVD CARDIO: Regular rate, well-perfused, normal S1 and S2 PULM:  CTAB no wheezing or rhonchi GI/GU:  non-distended, non-tender MSK/SPINE:  No gross deformities, no edema SKIN:  no rash, atraumatic   *Additional and/or pertinent findings included in MDM below  Diagnostic and Interventional Summary    EKG Interpretation  Date/Time:  Thursday September 08 2022 22:56:15 EDT Ventricular Rate:  82 PR Interval:  150 QRS Duration: 100 QT Interval:  380 QTC Calculation: 443 R Axis:   -22 Text Interpretation: Normal sinus rhythm Incomplete right bundle branch block Possible Lateral infarct , age undetermined Abnormal ECG When compared with ECG of 17-Feb-2018 01:30, Incomplete right bundle branch block is now Present Borderline criteria for Lateral infarct are now Present Confirmed by Gerlene Fee [phone]) on 09/08/2022 11:24:15 PM  Labs Reviewed  BASIC METABOLIC PANEL  CBC  TROPONIN I (HIGH SENSITIVITY)  TROPONIN I (HIGH SENSITIVITY)    DG Chest 2 View  Final Result      Medications - No data to display   Procedures  /  Critical Care Procedures  ED Course and Medical Decision Making  Initial Impression and Ddx Chest pain, overall favored noncardiac.  Sharp, intermittent, seems very random.  Not exertional.  Suspect either MSK or GERD or stress related pain.  Does not have very many cardiovascular risk factors.  Doubt PE.  Past medical/surgical history that increases complexity of ED encounter: None  Interpretation of Diagnostics I personally reviewed the EKG and my interpretation is as follows: Sinus rhythm without  ischemic changes  Labs reassuring with no significant blood count or electrolyte disturbance, troponin negative  Patient Reassessment and Ultimate Disposition/Management     With reassuring evaluation patient is appropriate for discharge, advised cardiology follow-up.  Patient management required discussion with the following services or consulting groups:  None  Complexity of Problems Addressed Acute illness or injury that poses threat of life of bodily function  Additional Data Reviewed and Analyzed Further history obtained from: Further history from spouse/family member  Additional Factors Impacting ED Encounter Risk None  Barth Kirks. Sedonia Small, [place] [email or web address]  Final Clinical Impressions(s) / ED Diagnoses     ICD-10-CM   1. Chest pain, unspecified type  R07.9       ED Discharge Orders     None        Discharge Instructions Discussed with and Provided to Patient:    Discharge Instructions      You were evaluated in the Emergency Department and after careful evaluation, we did not find any emergent condition requiring admission or further testing in the hospital.  Your exam/testing today is overall reassuring.  Recommend follow-up with your primary care doctor and/or cardiology to discuss your symptoms.  Please return to the Emergency Department if you experience any worsening of your condition.   Thank you for allowing [place] to be a part of your care.      Maudie Flakes, MD 09/09/22 [phone]

## 2022-11-24 IMAGING — CT CT RENAL STONE PROTOCOL
2 of 4 series · 16 of 46 positions shown, 18 images · non-contrast
Comparison: CT renal 09/09/2018, CT renal 06/16/2015

CLINICAL DATA: Flank pain.  Left lower quadrant pain.

EXAM:
CT ABDOMEN AND PELVIS WITHOUT CONTRAST
TECHNIQUE: Multidetector CT imaging of the abdomen and pelvis was performed
following the standard protocol without IV contrast.

[Series 2: stone full · axial · 0.79mm/px · z∈[-1128,-623]mm · 13 of 111 slices shown, 15 images]
[im 5/111  soft-tissue]
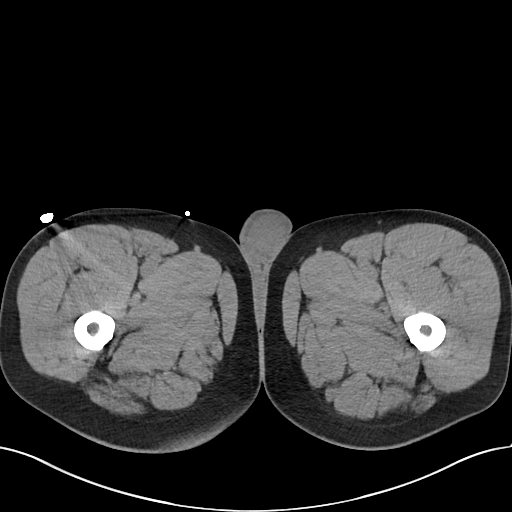
[im 5/111  bone]
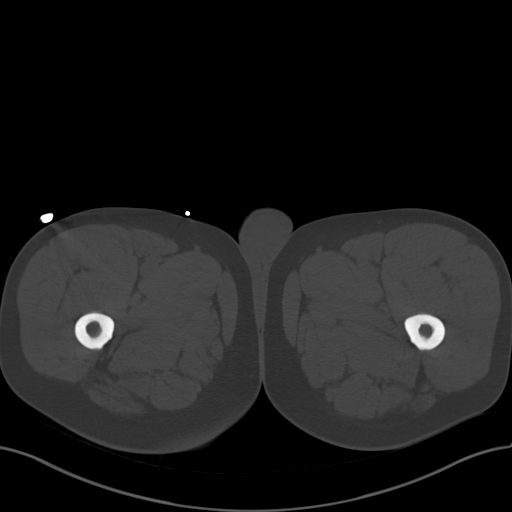
[im 14/111  soft-tissue]
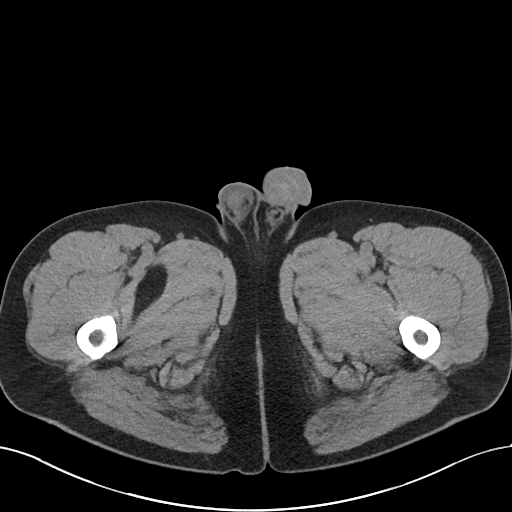
[im 23/111  soft-tissue]
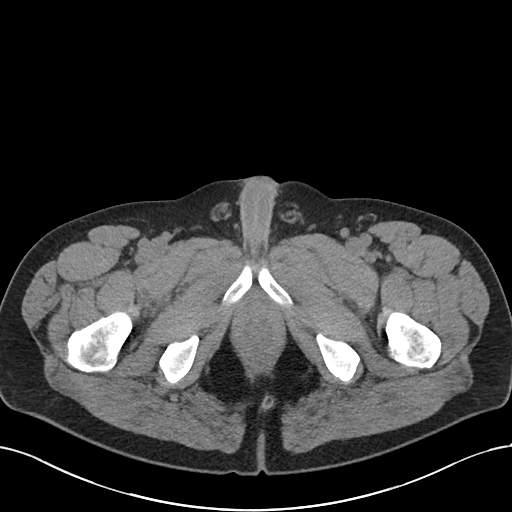
[im 33/111  soft-tissue]
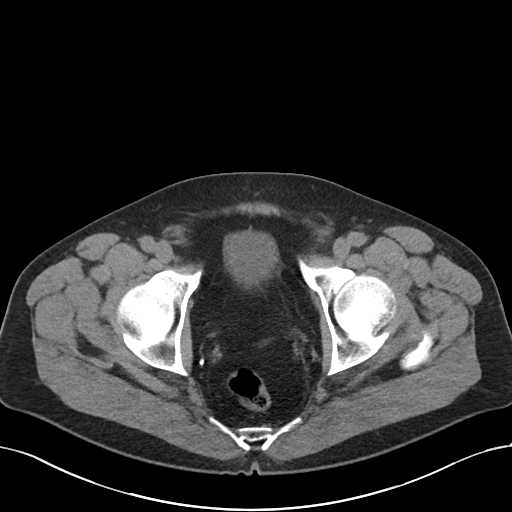
[im 37/111  soft-tissue]
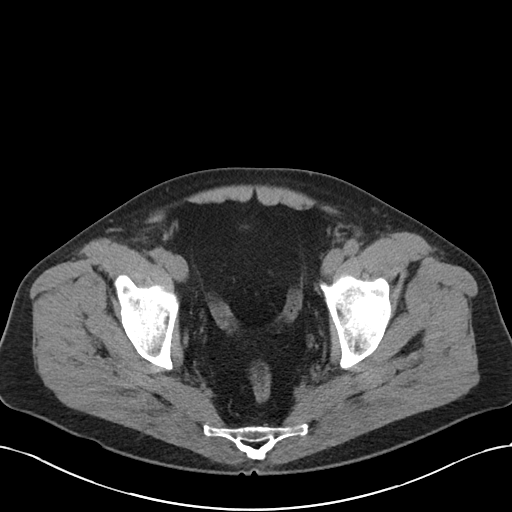
[im 46/111  soft-tissue]
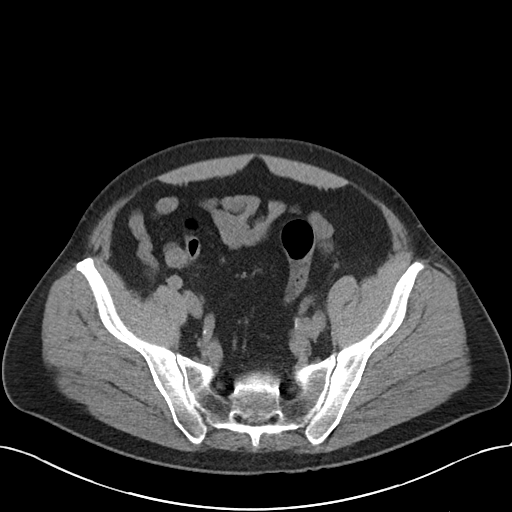
[im 56/111  soft-tissue]
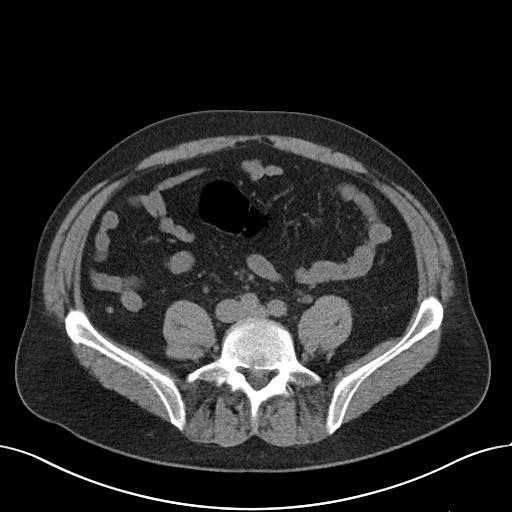
[im 65/111  soft-tissue]
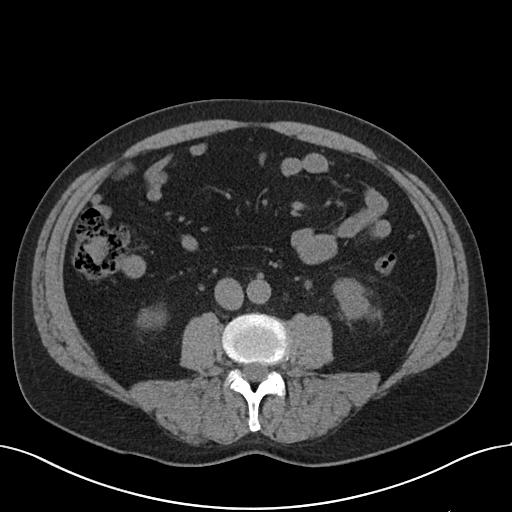
[im 74/111  soft-tissue]
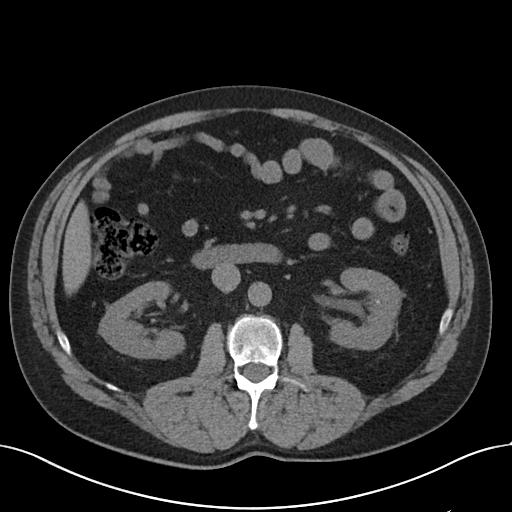
[im 74/111  bone]
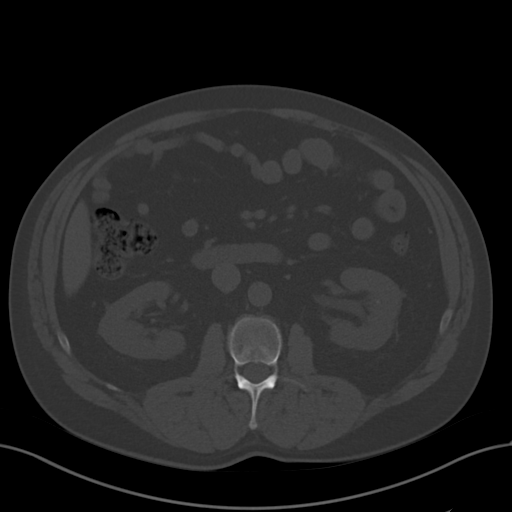
[im 78/111  soft-tissue]
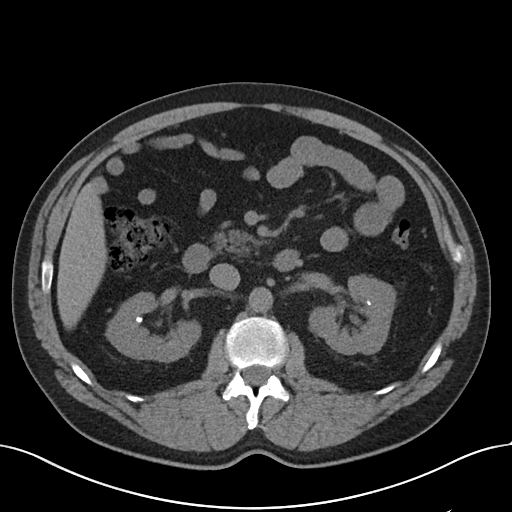
[im 88/111  soft-tissue]
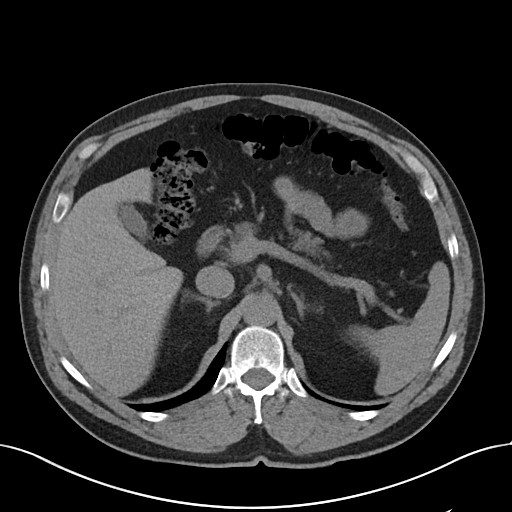
[im 97/111  soft-tissue]
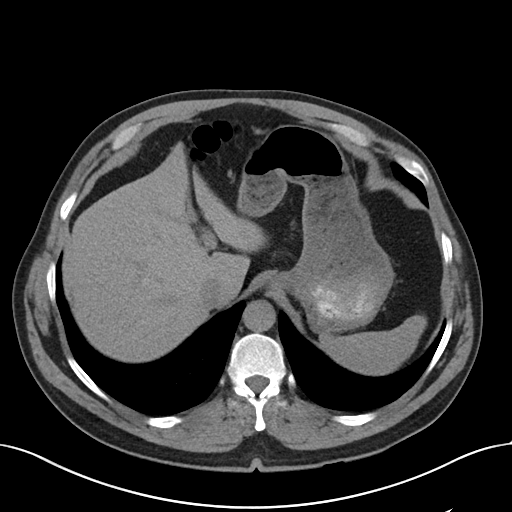
[im 106/111  soft-tissue]
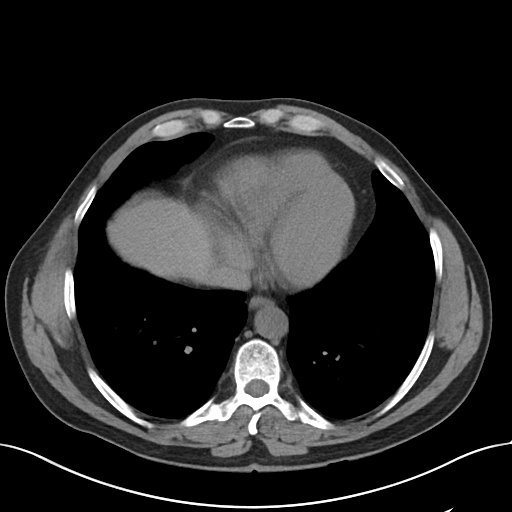

[Series 5: coronal · coronal · 0.88mm/px · 3 of 100 slices shown]
[im 34/100  soft-tissue]
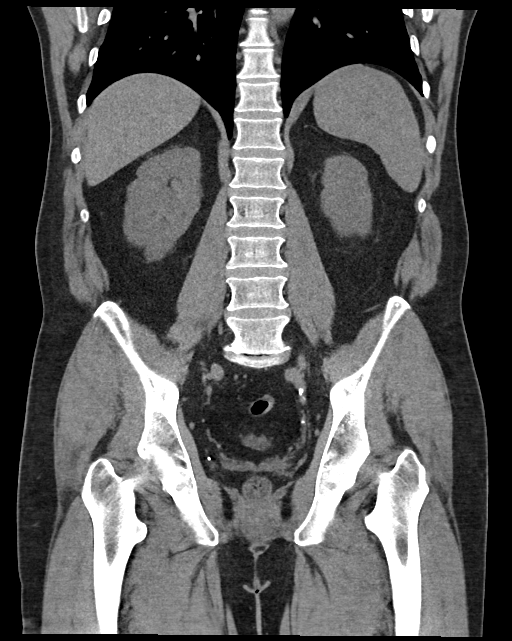
[im 45/100  soft-tissue]
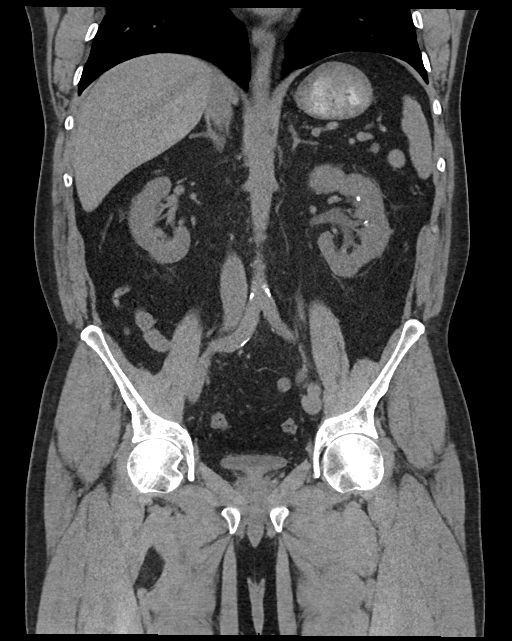
[im 56/100  soft-tissue]
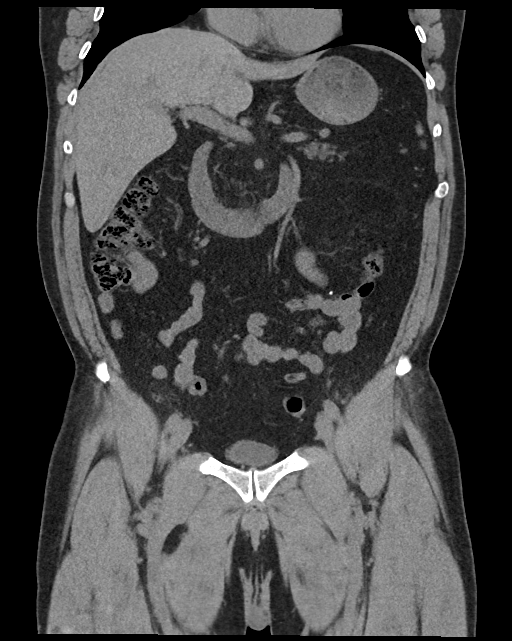

[16 of 46 positions shown; findings below may reference images not displayed]

FINDINGS: Lower chest: No acute abnormality.

Hepatobiliary: No focal liver abnormality. No gallstones,
gallbladder wall thickening, or pericholecystic fluid. No biliary
dilatation.

Pancreas: No focal lesion. Normal pancreatic contour. No surrounding
inflammatory changes. No main pancreatic ductal dilatation.

Spleen: Normal in size without focal abnormality. Couple splenule is
are noted.

Adrenals/Urinary Tract:

No adrenal nodule bilaterally.

Nonspecific mild bilateral perinephric stranding. Couple of
bilateral calcified stones with largest: 2-3 mm left nephrolithiasis
and 4 mm right nephrolithiasis. Mild fullness of the left ureter and
pelvocaliceal system with no frank hydronephrosis. No right
hydroureteronephrosis. There is a 3 mm calcified stone within the
distal left ureter ([DATE]). No right ureterolithiasis. No
contour-deforming renal mass.

The urinary bladder is unremarkable.

Stomach/Bowel: Stomach is within normal limits. No evidence of bowel
wall thickening or dilatation. Appendix appears normal.

Vascular/Lymphatic: No abdominal aorta or iliac aneurysm. Mild
atherosclerotic plaque of the aorta and its branches. No abdominal,
pelvic, or inguinal lymphadenopathy.

Reproductive: Prostate is unremarkable.

Other: No intraperitoneal free fluid. No intraperitoneal free gas.
No organized fluid collection.

Musculoskeletal:

No abdominal wall hernia or abnormality.

Similar-appearing lucency within the L2 vertebral body. No
suspicious lytic or blastic osseous lesions. No acute displaced
fracture. Multilevel degenerative changes of the spine.
IMPRESSION: 1. Early/minimally obstructive 3 mm left distal ureterolithiasis.
2. Bilateral nonobstructive nephrolithiasis measuring up to 4 mm on
the right and 2-3 mm on the left.
3.  Aortic Atherosclerosis ([id number]).
